# Patient Record
Sex: Female | Born: 1975 | Race: White | Hispanic: No | Marital: Married | Smoking: Never smoker
Health system: Southern US, Community
[De-identification: ages and names within clinical notes are randomized; demographics above are authoritative.]

---

## 2016-01-09 ENCOUNTER — Ambulatory Visit: Payer: Self-pay | Admitting: Physician Assistant

## 2016-01-09 ENCOUNTER — Encounter: Payer: Self-pay | Admitting: Physician Assistant

## 2016-01-09 VITALS — BP 129/77 | HR 81 | Temp 98.7°F

## 2016-01-09 DIAGNOSIS — J0100 Acute maxillary sinusitis, unspecified: Secondary | ICD-10-CM

## 2016-01-09 MED ORDER — AZITHROMYCIN 250 MG PO TABS: ORAL_TABLET | ORAL | 0 refills | Status: DC

## 2016-01-09 MED ORDER — FLUTICASONE PROPIONATE 50 MCG/ACT NA SUSP: 2.0000 | Freq: Every day | NASAL | 6 refills | Status: DC

## 2016-01-09 NOTE — Progress Notes (Signed)
S: C/o runny nose and congestion for 5 days, no fever, chills, cp/sob, v/d; mucus is green and thick, cough is sporadic, c/o of facial and dental pain.   Using otc meds: dayquil and nyquil  O: PE: vitals wnl, nad, perrl eomi, normocephalic, tms dull, nasal mucosa red and swollen, throat injected, neck supple no lymph, lungs c t a, cv rrr, neuro intact  A:  Acute sinusitis   P: drink fluids, continue regular meds , use otc meds of choice, return if not improving in 5 days, return earlier if worsening , zpack flonase

## 2016-06-18 ENCOUNTER — Encounter: Payer: Self-pay | Admitting: Obstetrics and Gynecology

## 2016-06-19 ENCOUNTER — Ambulatory Visit (INDEPENDENT_AMBULATORY_CARE_PROVIDER_SITE_OTHER): Payer: Managed Care, Other (non HMO) | Admitting: Certified Nurse Midwife

## 2016-06-19 ENCOUNTER — Encounter: Payer: Self-pay | Admitting: Certified Nurse Midwife

## 2016-06-19 VITALS — BP 124/77 | HR 82 | Ht 63.0 in | Wt 196.0 lb

## 2016-06-19 DIAGNOSIS — Z1231 Encounter for screening mammogram for malignant neoplasm of breast: Secondary | ICD-10-CM

## 2016-06-19 DIAGNOSIS — Z124 Encounter for screening for malignant neoplasm of cervix: Secondary | ICD-10-CM | POA: Diagnosis not present

## 2016-06-19 DIAGNOSIS — Z01419 Encounter for gynecological examination (general) (routine) without abnormal findings: Secondary | ICD-10-CM

## 2016-06-19 NOTE — Patient Instructions (Signed)

## 2016-06-19 NOTE — Progress Notes (Signed)
GYNECOLOGY ANNUAL PREVENTATIVE CARE ENCOUNTER NOTE  Subjective:   Patricia Leon is a 41 y.o. female here for a routine annual gynecologic exam.  Current complaints: None.   Denies abnormal vaginal bleeding, discharge, pelvic pain, problems with intercourse or other gynecologic concerns.    Gynecologic History Patient's last menstrual period was 05/31/2016. Contraception: condoms Last Pap: never had one.  Last mammogram: never had one  Obstetric History OB History  Gravida Para Term Preterm AB Living  0 0 0 0 0 0  SAB TAB Ectopic Multiple Live Births  0 0 0 0 0        History reviewed. No pertinent past medical history.  History reviewed. No pertinent surgical history.  Current Outpatient Prescriptions on File Prior to Visit  Medication Sig Dispense Refill  . etodolac (LODINE) 300 MG capsule Take 300 mg by mouth as needed.     No current facility-administered medications on file prior to visit.     Allergies  Allergen Reactions  . Sulfa Antibiotics     Social History   Social History  . Marital status: Married    Spouse name: N/A  . Number of children: N/A  . Years of education: N/A   Occupational History  . Not on file.   Social History Main Topics  . Smoking status: Never Smoker  . Smokeless tobacco: Never Used  . Alcohol use Yes     Comment: Socially   . Drug use: No  . Sexual activity: Yes    Birth control/ protection: Condom   Other Topics Concern  . Not on file   Social History Narrative  . No narrative on file    Family History  Problem Relation Age of Onset  . Varicose Veins Maternal Grandmother   . Heart disease Maternal Grandfather     The following portions of the patient's history were reviewed and updated as appropriate: allergies, current medications, past family history, past medical history, past social history, past surgical history and problem list.  Review of Systems Constitutional: negative Eyes: negative Ears, nose, mouth,  throat, and face: negative Respiratory: negative Cardiovascular: negative Gastrointestinal: negative Genitourinary:negative Integument/breast: negative Hematologic/lymphatic: negative Musculoskeletal:negative Neurological: negative Behavioral/Psych: negative Endocrine: negative   Objective:  BP 124/77 (BP Location: Left Arm, Patient Position: Sitting, Cuff Size: Large)   Pulse 82   Ht  (1.6 m)   Wt 196 lb (88.9 kg)   LMP 05/31/2016   BMI 34.72 kg/m  CONSTITUTIONAL: Well-developed, well-nourished female in no acute distress.  HENT:  Normocephalic, atraumatic, External right and left ear normal. Oropharynx is clear and moist EYES: Conjunctivae and EOM are normal. NECK: Normal range of motion, supple, no masses.  Normal thyroid.  SKIN: Skin is warm and dry. No rash noted. Not diaphoretic. No erythema. No pallor. NEUROLOGIC: Alert and oriented to person, place, and time. Normal reflexes, muscle tone coordination. No cranial nerve deficit noted. PSYCHIATRIC: Normal mood and affect. Normal behavior. Normal judgment and thought content. CARDIOVASCULAR: Normal heart rate noted, regular rhythm RESPIRATORY: Clear to auscultation bilaterally. Effort and breath sounds normal, no problems with respiration noted. BREASTS: Symmetric in size. No masses, skin changes, nipple drainage, or lymphadenopathy. ABDOMEN: Soft, normal bowel sounds, no distention noted.  No tenderness, rebound or guarding.  PELVIC: Normal appearing external genitalia; normal appearing vaginal mucosa and cervix. white discharge noted.  Pap smear obtained.  Normal uterine size, no other palpable masses, no uterine or adnexal tenderness. MUSCULOSKELETAL: Normal range of motion. No tenderness.  No cyanosis, clubbing,  or edema.  2+ distal pulses.   Assessment:  Annual gynecologic examination with pap smear Mammogram scheduled Labs: CBC, TSH, Lipid panel, fasting glucose. She will return when fasting to have labs drawn    Plan:  Will follow up results of pap smear and manage accordingly. Mammogram scheduled Routine preventative health maintenance measures emphasized. Please refer to After Visit Summary for other counseling recommendations.    Doreene Burke, CNM [facility]

## 2016-06-23 ENCOUNTER — Other Ambulatory Visit: Payer: Managed Care, Other (non HMO)

## 2016-06-23 ENCOUNTER — Other Ambulatory Visit: Payer: Self-pay | Admitting: Certified Nurse Midwife

## 2016-06-24 ENCOUNTER — Telehealth: Payer: Self-pay | Admitting: Certified Nurse Midwife

## 2016-06-24 LAB — TSH: TSH: 2.25 u[IU]/mL (ref 0.450–4.500)

## 2016-06-24 LAB — PAP IG AND HPV HIGH-RISK
HPV, HIGH-RISK: NEGATIVE
PAP SMEAR COMMENT: 0

## 2016-06-24 LAB — CBC WITH DIFFERENTIAL/PLATELET
BASOS: 0 %
Basophils Absolute: 0 10*3/uL (ref 0.0–0.2)
EOS (ABSOLUTE): 0.1 10*3/uL (ref 0.0–0.4)
EOS: 2 %
HEMATOCRIT: 42.4 % (ref 34.0–46.6)
Hemoglobin: 13.9 g/dL (ref 11.1–15.9)
IMMATURE GRANULOCYTES: 1 %
Immature Grans (Abs): 0 10*3/uL (ref 0.0–0.1)
Lymphocytes Absolute: 1.8 10*3/uL (ref 0.7–3.1)
Lymphs: 28 %
MCH: 30 pg (ref 26.6–33.0)
MCHC: 32.8 g/dL (ref 31.5–35.7)
MCV: 91 fL (ref 79–97)
MONOS ABS: 0.8 10*3/uL (ref 0.1–0.9)
Monocytes: 12 %
NEUTROS PCT: 57 %
Neutrophils Absolute: 3.7 10*3/uL (ref 1.4–7.0)
PLATELETS: 225 10*3/uL (ref 150–379)
RBC: 4.64 x10E6/uL (ref 3.77–5.28)
RDW: 12.8 % (ref 12.3–15.4)
WBC: 6.4 10*3/uL (ref 3.4–10.8)

## 2016-06-24 LAB — BASIC METABOLIC PANEL
BUN / CREAT RATIO: 16 (ref 9–23)
BUN: 13 mg/dL (ref 6–24)
CALCIUM: 9.9 mg/dL (ref 8.7–10.2)
CO2: 20 mmol/L (ref 18–29)
Chloride: 105 mmol/L (ref 96–106)
Creatinine, Ser: 0.81 mg/dL (ref 0.57–1.00)
GFR, EST AFRICAN AMERICAN: 104 mL/min/{1.73_m2} (ref >59)
GFR, EST NON AFRICAN AMERICAN: 90 mL/min/{1.73_m2} (ref >59)
Glucose: 94 mg/dL (ref 65–99)
POTASSIUM: 4.7 mmol/L (ref 3.5–5.2)
Sodium: 147 mmol/L — ABNORMAL HIGH (ref 134–144)

## 2016-06-24 LAB — LIPID PANEL
Chol/HDL Ratio: 4.9 ratio — ABNORMAL HIGH (ref 0.0–4.4)
Cholesterol, Total: 161 mg/dL (ref 100–199)
HDL: 33 mg/dL — AB (ref >39)
LDL Calculated: 97 mg/dL (ref 0–99)
Triglycerides: 153 mg/dL — ABNORMAL HIGH (ref 0–149)
VLDL CHOLESTEROL CAL: 31 mg/dL (ref 5–40)

## 2016-06-24 LAB — VITAMIN D 25 HYDROXY (VIT D DEFICIENCY, FRACTURES): Vit D, 25-Hydroxy: 37.7 ng/mL (ref 30.0–100.0)

## 2016-06-24 LAB — HEMOGLOBIN A1C
ESTIMATED AVERAGE GLUCOSE: 105 mg/dL
HEMOGLOBIN A1C: 5.3 % (ref 4.8–5.6)

## 2016-06-24 NOTE — Telephone Encounter (Signed)
Message left for Nalia to call back for pap smear results.   Doreene Burke, CNM

## 2016-06-25 ENCOUNTER — Telehealth: Payer: Self-pay | Admitting: Certified Nurse Midwife

## 2016-06-25 NOTE — Telephone Encounter (Signed)
error 

## 2016-06-25 NOTE — Telephone Encounter (Signed)
Patient returned Annie's call for results  Please call

## 2016-07-22 ENCOUNTER — Ambulatory Visit
Admission: RE | Admit: 2016-07-22 | Discharge: 2016-07-22 | Disposition: A | Discharge status: Home / Self Care | Payer: Managed Care, Other (non HMO) | Source: Ambulatory Visit | Attending: Certified Nurse Midwife | Admitting: Certified Nurse Midwife

## 2016-07-22 DIAGNOSIS — Z1231 Encounter for screening mammogram for malignant neoplasm of breast: Secondary | ICD-10-CM | POA: Diagnosis not present

## 2016-07-22 DIAGNOSIS — Z01419 Encounter for gynecological examination (general) (routine) without abnormal findings: Secondary | ICD-10-CM

## 2017-06-23 ENCOUNTER — Encounter: Payer: Managed Care, Other (non HMO) | Admitting: Certified Nurse Midwife

## 2017-06-28 ENCOUNTER — Encounter: Payer: Self-pay | Admitting: Certified Nurse Midwife

## 2017-06-28 ENCOUNTER — Ambulatory Visit (INDEPENDENT_AMBULATORY_CARE_PROVIDER_SITE_OTHER): Payer: Managed Care, Other (non HMO) | Admitting: Certified Nurse Midwife

## 2017-06-28 VITALS — BP 110/68 | HR 65 | Ht 63.0 in | Wt 203.0 lb

## 2017-06-28 DIAGNOSIS — Z1239 Encounter for other screening for malignant neoplasm of breast: Secondary | ICD-10-CM

## 2017-06-28 DIAGNOSIS — Z0000 Encounter for general adult medical examination without abnormal findings: Secondary | ICD-10-CM

## 2017-06-28 DIAGNOSIS — Z1231 Encounter for screening mammogram for malignant neoplasm of breast: Secondary | ICD-10-CM

## 2017-06-28 NOTE — Patient Instructions (Signed)
Preventive Care 18-39 Years, Female Preventive care refers to lifestyle choices and visits with your health care provider that can promote health and wellness. What does preventive care include?  A yearly physical exam. This is also called an annual well check.  Dental exams once or twice a year.  Routine eye exams. Ask your health care provider how often you should have your eyes checked.  Personal lifestyle choices, including: ? Daily care of your teeth and gums. ? Regular physical activity. ? Eating a healthy diet. ? Avoiding tobacco and drug use. ? Limiting alcohol use. ? Practicing safe sex. ? Taking vitamin and mineral supplements as recommended by your health care provider. What happens during an annual well check? The services and screenings done by your health care provider during your annual well check will depend on your age, overall health, lifestyle risk factors, and family history of disease. Counseling Your health care provider may ask you questions about your:  Alcohol use.  Tobacco use.  Drug use.  Emotional well-being.  Home and relationship well-being.  Sexual activity.  Eating habits.  Work and work Statistician.  Method of birth control.  Menstrual cycle.  Pregnancy history.  Screening You may have the following tests or measurements:  Height, weight, and BMI.  Diabetes screening. This is done by checking your blood sugar (glucose) after you have not eaten for a while (fasting).  Blood pressure.  Lipid and cholesterol levels. These may be checked every 5 years starting at age 66.  Skin check.  Hepatitis C blood test.  Hepatitis B blood test.  Sexually transmitted disease (STD) testing.  BRCA-related cancer screening. This may be done if you have a family history of breast, ovarian, tubal, or peritoneal cancers.  Pelvic exam and Pap test. This may be done every 3 years starting at age 40. Starting at age 59, this may be done every 5  years if you have a Pap test in combination with an HPV test.  Discuss your test results, treatment options, and if necessary, the need for more tests with your health care provider. Vaccines Your health care provider may recommend certain vaccines, such as:  Influenza vaccine. This is recommended every year.  Tetanus, diphtheria, and acellular pertussis (Tdap, Td) vaccine. You may need a Td booster every 10 years.  Varicella vaccine. You may need this if you have not been vaccinated.  HPV vaccine. If you are 69 or younger, you may need three doses over 6 months.  Measles, mumps, and rubella (MMR) vaccine. You may need at least one dose of MMR. You may also need a second dose.  Pneumococcal 13-valent conjugate (PCV13) vaccine. You may need this if you have certain conditions and were not previously vaccinated.  Pneumococcal polysaccharide (PPSV23) vaccine. You may need one or two doses if you smoke cigarettes or if you have certain conditions.  Meningococcal vaccine. One dose is recommended if you are age 27-21 years and a first-year college student living in a residence hall, or if you have one of several medical conditions. You may also need additional booster doses.  Hepatitis A vaccine. You may need this if you have certain conditions or if you travel or work in places where you may be exposed to hepatitis A.  Hepatitis B vaccine. You may need this if you have certain conditions or if you travel or work in places where you may be exposed to hepatitis B.  Haemophilus influenzae type b (Hib) vaccine. You may need this if  you have certain risk factors.  Talk to your health care provider about which screenings and vaccines you need and how often you need them. This information is not intended to replace advice given to you by your health care provider. Make sure you discuss any questions you have with your health care provider. Document Released: 04/21/2001 Document Revised: 11/13/2015  Document Reviewed: 12/25/2014 Elsevier Interactive Patient Education  [employer].

## 2017-06-28 NOTE — Progress Notes (Addendum)
GYNECOLOGY ANNUAL PREVENTATIVE CARE ENCOUNTER NOTE  Subjective:   Patricia BlamerSusan Leon is a 42 y.o. G0P0000 female here for a routine annual gynecologic exam.  Current complaints: none.   Denies abnormal vaginal bleeding, discharge, pelvic pain, problems with intercourse or other gynecologic concerns. She states that she is not using contraception because she and her partner are going to see what happens.    Gynecologic History No LMP recorded. Contraception: none Last Pap: 06/19/16. Results were: normal Last mammogram: 07/22/2016. Results were: normal  Obstetric History OB History  Gravida Para Term Preterm AB Living  0 0 0 0 0 0  SAB TAB Ectopic Multiple Live Births  0 0 0 0 0    History reviewed. No pertinent past medical history.  History reviewed. No pertinent surgical history.  Current Outpatient Medications on File Prior to Visit  Medication Sig Dispense Refill  . etodolac (LODINE) 300 MG capsule Take 300 mg by mouth as needed.     No current facility-administered medications on file prior to visit.     Allergies  Allergen Reactions  . Sulfa Antibiotics     Social History   Socioeconomic History  . Marital status: Married    Spouse name: Not on file  . Number of children: Not on file  . Years of education: Not on file  . Highest education level: Not on file  Occupational History  . Not on file  Social Needs  . Financial resource strain: Not on file  . Food insecurity:    Worry: Not on file    Inability: Not on file  . Transportation needs:    Medical: Not on file    Non-medical: Not on file  Tobacco Use  . Smoking status: Never Smoker  . Smokeless tobacco: Never Used  Substance and Sexual Activity  . Alcohol use: Yes    Comment: Socially   . Drug use: No  . Sexual activity: Yes    Birth control/protection: Condom  Lifestyle  . Physical activity:    Days per week: Not on file    Minutes per session: Not on file  . Stress: Not on file  Relationships  .  Social connections:    Talks on phone: Not on file    Gets together: Not on file    Attends religious service: Not on file    Active member of club or organization: Not on file    Attends meetings of clubs or organizations: Not on file    Relationship status: Not on file  . Intimate partner violence:    Fear of current or ex partner: Not on file    Emotionally abused: Not on file    Physically abused: Not on file    Forced sexual activity: Not on file  Other Topics Concern  . Not on file  Social History Narrative  . Not on file    Family History  Problem Relation Age of Onset  . Varicose Veins Maternal Grandmother   . Heart disease Maternal Grandfather   . Breast cancer Maternal Aunt [id number]       1/2 sister by same father    The following portions of the patient's history were reviewed and updated as appropriate: allergies, current medications, past family history, past medical history, past social history, past surgical history and problem list.  Review of Systems Pertinent items noted in HPI and remainder of comprehensive ROS otherwise negative.   Objective:  There were no vitals taken for this visit. CONSTITUTIONAL: Well-developed, well-nourished  over weight female in no acute distress.  HENT:  Normocephalic, atraumatic, External right and left ear normal. Oropharynx is clear and moist EYES: Conjunctivae and EOM are normal. Pupils are equal, round, and reactive to light. No scleral icterus.  NECK: Normal range of motion, supple, no masses.  Normal thyroid.  SKIN: Skin is warm and dry. No rash noted. Not diaphoretic. No erythema. No pallor. NEUROLOGIC: Alert and oriented to person, place, and time. Normal reflexes, muscle tone coordination. No cranial nerve deficit noted. PSYCHIATRIC: Normal mood and affect. Normal behavior. Normal judgment and thought content. CARDIOVASCULAR: Normal heart rate noted, regular rhythm RESPIRATORY: Clear to auscultation bilaterally. Effort and  breath sounds normal, no problems with respiration noted. BREASTS: Symmetric in size. No masses, skin changes, nipple drainage, or lymphadenopathy. ABDOMEN: Soft, normal bowel sounds, no distention noted.  No tenderness, rebound or guarding.  PELVIC: Normal appearing external genitalia; normal appearing vaginal mucosa and cervix.  No abnormal discharge noted.  Pap smear obtained.  Normal uterine size, no other palpable masses, no uterine or adnexal tenderness. MUSCULOSKELETAL: Normal range of motion. No tenderness.  No cyanosis, clubbing, or edema.  2+ distal pulses.   Assessment and Plan:  Well women exam. She declines any fertility testing at this time stating that she will try for a few more months without intervention. Pap smear not indicated Mammogram scheduled Routine preventative health maintenance measures emphasized. Please refer to After Visit Summary for other counseling recommendations.    Doreene Burke, CNM

## 2017-08-13 ENCOUNTER — Encounter (INDEPENDENT_AMBULATORY_CARE_PROVIDER_SITE_OTHER): Payer: Self-pay

## 2017-08-13 ENCOUNTER — Ambulatory Visit
Admission: RE | Admit: 2017-08-13 | Discharge: 2017-08-13 | Disposition: A | Discharge status: Home / Self Care | Payer: Managed Care, Other (non HMO) | Source: Ambulatory Visit | Attending: Certified Nurse Midwife | Admitting: Certified Nurse Midwife

## 2017-08-13 DIAGNOSIS — Z1231 Encounter for screening mammogram for malignant neoplasm of breast: Secondary | ICD-10-CM | POA: Diagnosis present

## 2017-08-13 DIAGNOSIS — Z1239 Encounter for other screening for malignant neoplasm of breast: Secondary | ICD-10-CM

## 2017-08-16 IMAGING — MG 2D DIGITAL SCREENING BILATERAL MAMMOGRAM WITH CAD AND ADJUNCT TO
8 of 13 series · 8 of 29 positions shown · non-contrast
Comparison: None.

CLINICAL DATA: Screening.

EXAM:
2D DIGITAL SCREENING BILATERAL MAMMOGRAM WITH CAD AND ADJUNCT TOMO

[L XCCL]
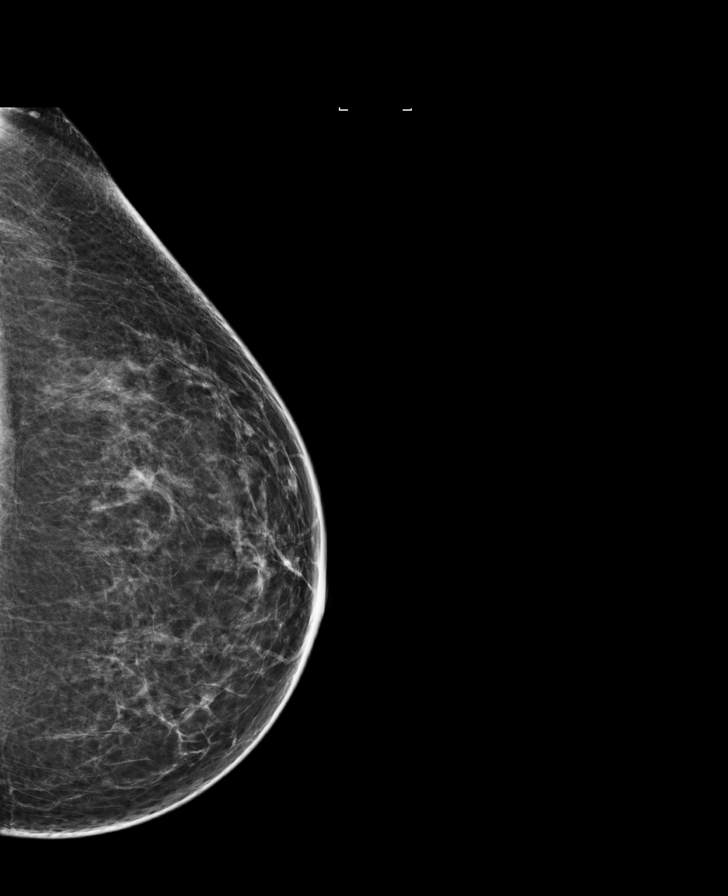

[R MLO synth-2D]
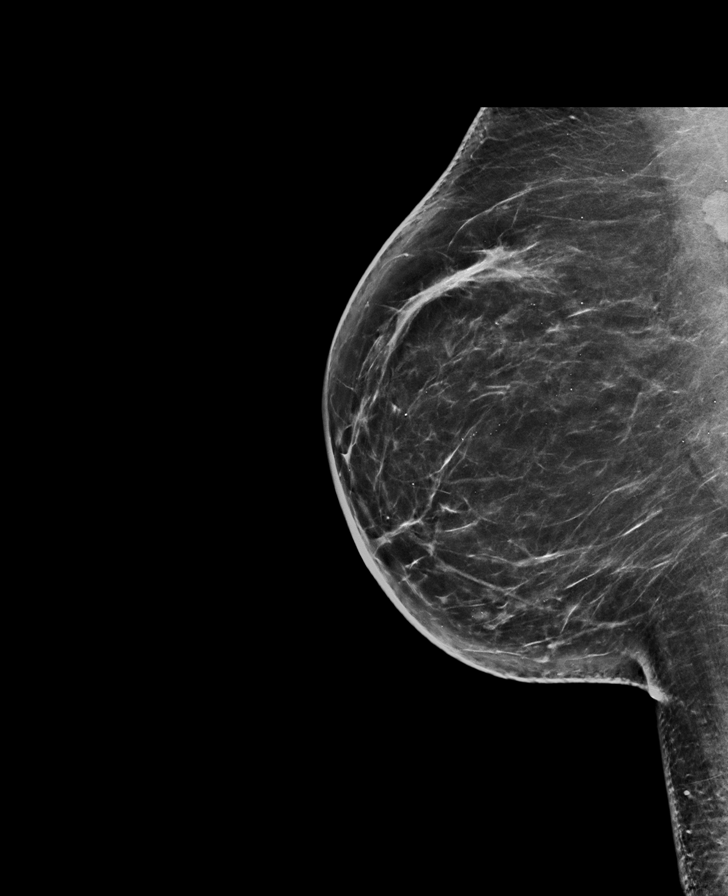

[L MLO]
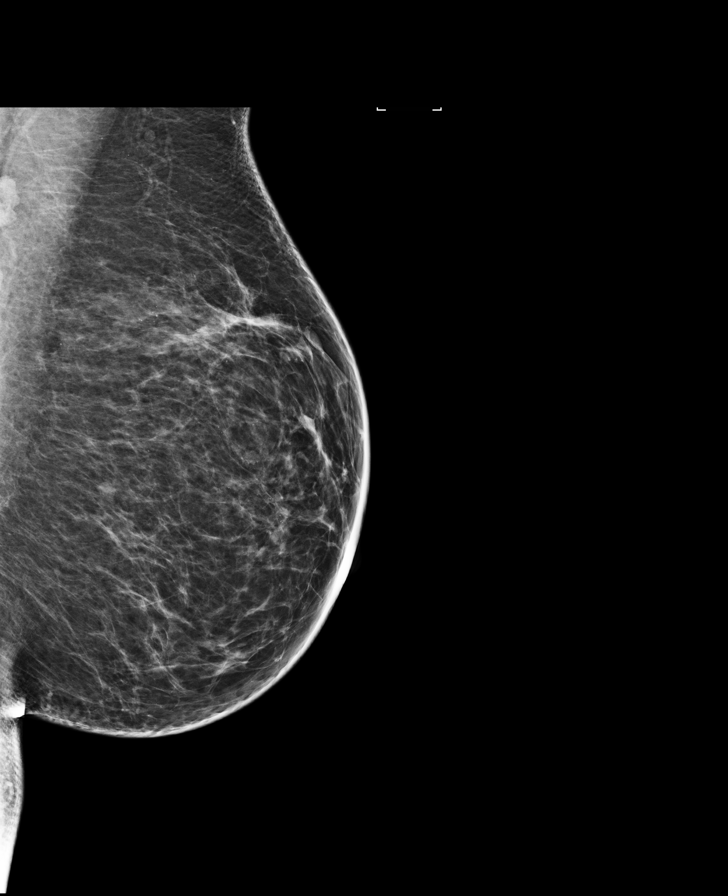

[R CC]
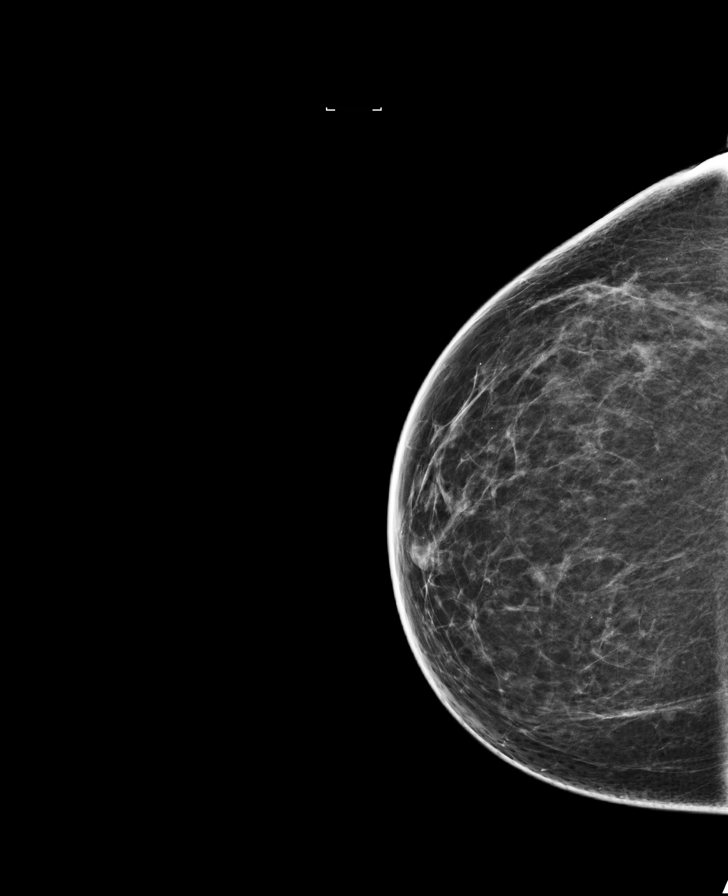

[R MLO]
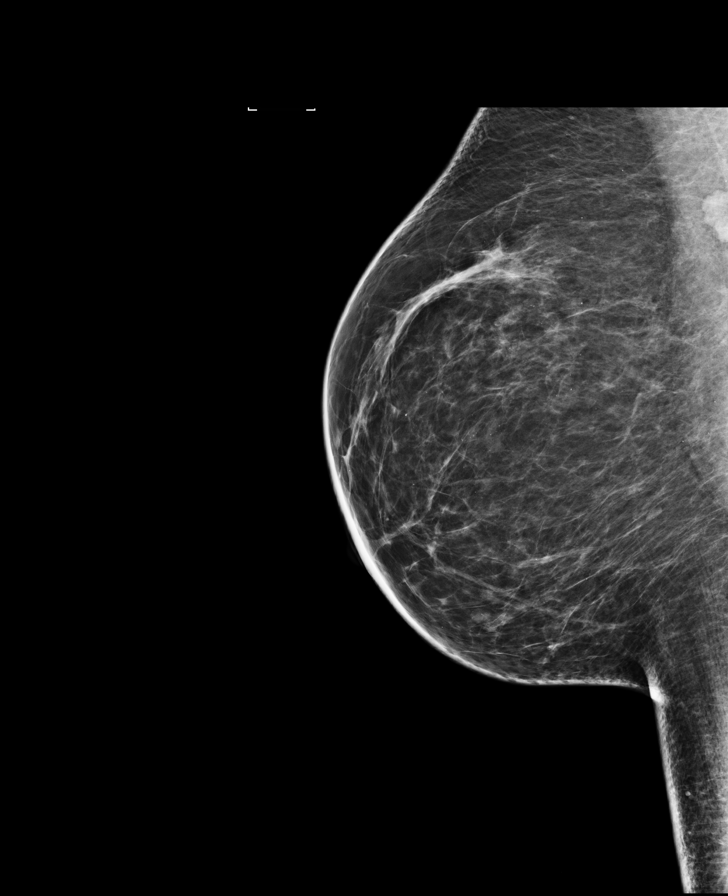

[R CC synth-2D]
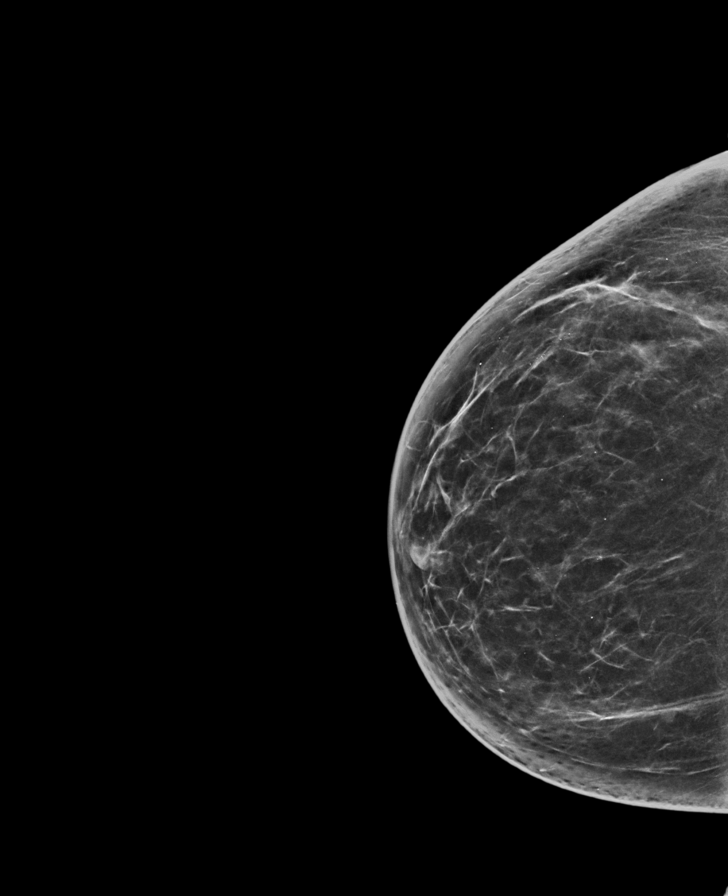

[L CC synth-2D]
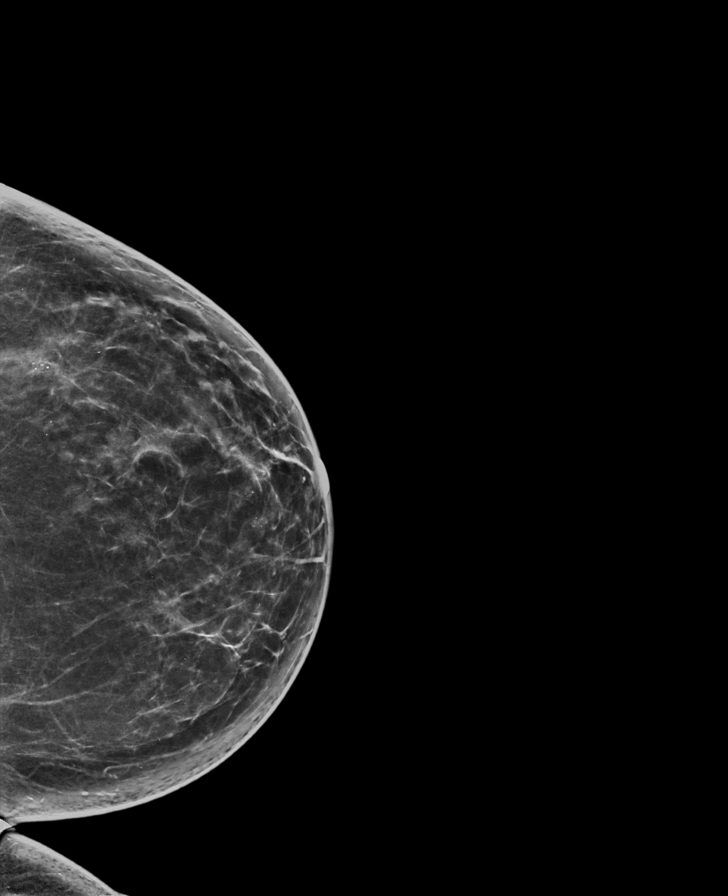

[L MLO synth-2D]
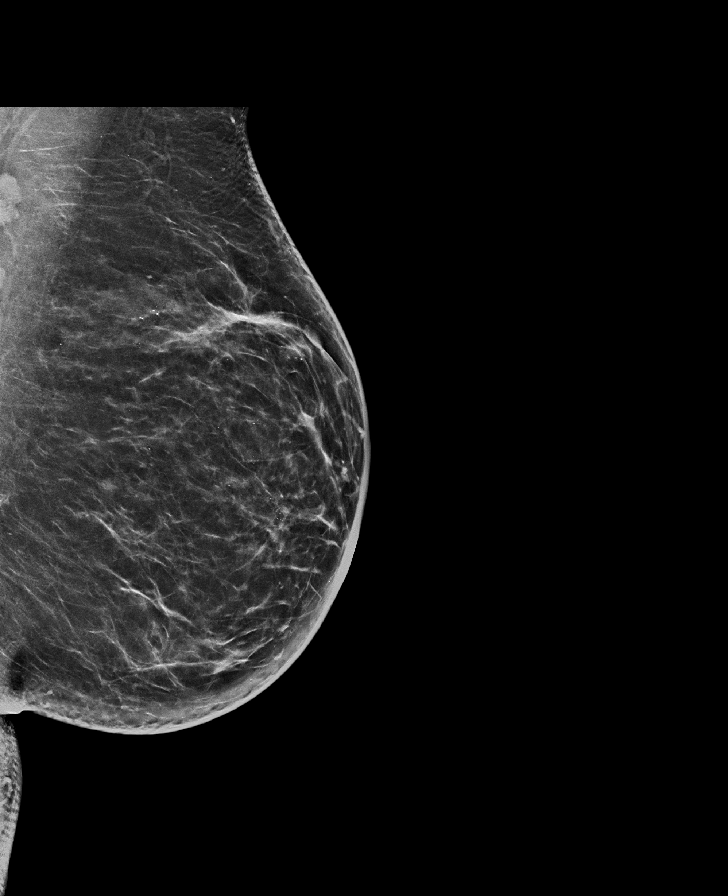

[8 of 29 positions shown; findings below may reference images not displayed]

ACR Breast Density Category b: There are scattered areas of
fibroglandular density.
FINDINGS: There are no findings suspicious for malignancy. Images were
processed with CAD.
IMPRESSION: No mammographic evidence of malignancy. A result letter of this
screening mammogram will be mailed directly to the patient.

RECOMMENDATION:
Screening mammogram in one year. (Code:EE-M-3AZ)

BI-RADS CATEGORY  1: Negative.

## 2017-11-23 ENCOUNTER — Ambulatory Visit: Payer: Self-pay | Admitting: Family Medicine

## 2017-11-23 VITALS — BP 147/65 | HR 97 | Temp 99.0°F | Resp 18

## 2017-11-23 DIAGNOSIS — R05 Cough: Secondary | ICD-10-CM

## 2017-11-23 DIAGNOSIS — R059 Cough, unspecified: Secondary | ICD-10-CM

## 2017-11-23 DIAGNOSIS — J0100 Acute maxillary sinusitis, unspecified: Secondary | ICD-10-CM

## 2017-11-23 MED ORDER — BENZONATATE 200 MG PO CAPS: 200.0000 mg | ORAL_CAPSULE | Freq: Every evening | ORAL | 0 refills | Status: DC | PRN

## 2017-11-23 MED ORDER — AMOXICILLIN-POT CLAVULANATE 875-125 MG PO TABS: 1.0000 | ORAL_TABLET | Freq: Two times a day (BID) | ORAL | 0 refills | Status: AC

## 2017-11-23 NOTE — Progress Notes (Signed)
Subjective: congestion     Patricia Leon is a 42 y.o. female who presents for evaluation of nasal congestion, facial pressure, fatigue, and nonproductive/productive cough with a small amount of green sputum for 5 days.  Patient reports that yesterday she developed a low-grade fever (T-max 100.4), which has continued today as well.  Reports the facial pressure began yesterday. Treatment to date: DayQuil and NyQuil.  Denies rash, nausea, vomiting, diarrhea, SOB, wheezing, chest or back pain, ear pain, sore throat, difficulty swallowing, confusion, anosmia/hyposmia, headache, body aches, ocular pruritis/discharge, or severe symptoms. History of smoking, asthma, COPD: Negative. History of recurrent sinus and/or lung infections: Negative. Antibiotic use in the last 3 months: Negative.   Review of Systems Pertinent items noted in HPI and remainder of comprehensive ROS otherwise negative.     Objective:   Physical Exam General: Awake, alert, and oriented. No acute distress. Well developed, hydrated and nourished. Appears stated age. Nontoxic appearance.  HEENT:  PND noted.  No erythema to posterior oropharynx.  No edema or exudates of pharynx or tonsils. No erythema or bulging of TM.  Mild erythema/edema to nasal mucosa.  Bilateral maxillary sinus tenderness, left greater than right.  Remainder of sinuses nontender. Supple neck without adenopathy. Cardiac: Heart rate and rhythm are normal. No murmurs, gallops, or rubs are auscultated. S1 and S2 are heard and are of normal intensity.  Respiratory: No signs of respiratory distress. Lungs clear. No tachypnea. Able to speak in full sentences without dyspnea. Nonlabored respirations.  Skin: Skin is warm, dry and intact. Appropriate color for ethnicity. No cyanosis noted.   Assessment:    sinusitis and viral upper respiratory illness   Plan:    Discussed diagnosis and treatment of URI. Discussed the diagnosis and treatment of sinusitis. Suggested  symptomatic OTC remedies. Nasal saline spray for congestion.   Prescribed Augmentin.  Prescribed Tessalon Perles to use as needed at night for cough.  Patient has taken these medications in the past and tolerated them well. Discussed side/adverse effects of all medications. Patient's blood pressure is 147/65 today.  Discussed normal values.  Advised patient to monitor this daily and report abnormal values to her primary care provider. Discussed red flag symptoms and circumstances with which to seek medical care.   New Prescriptions   AMOXICILLIN-CLAVULANATE (AUGMENTIN) 875-125 MG TABLET    Take 1 tablet by mouth 2 (two) times daily for 10 days.   BENZONATATE (TESSALON) 200 MG CAPSULE    Take 1 capsule (200 mg total) by mouth at bedtime as needed for cough.

## 2018-06-23 ENCOUNTER — Other Ambulatory Visit: Payer: Self-pay

## 2018-06-23 ENCOUNTER — Ambulatory Visit: Payer: Managed Care, Other (non HMO) | Admitting: Adult Health

## 2018-06-23 ENCOUNTER — Encounter: Payer: Self-pay | Admitting: Adult Health

## 2018-06-23 VITALS — BP 122/68 | HR 77 | Temp 98.4°F | Resp 14

## 2018-06-23 DIAGNOSIS — S46911A Strain of unspecified muscle, fascia and tendon at shoulder and upper arm level, right arm, initial encounter: Secondary | ICD-10-CM

## 2018-06-23 DIAGNOSIS — Z3202 Encounter for pregnancy test, result negative: Secondary | ICD-10-CM

## 2018-06-23 LAB — POCT URINALYSIS DIPSTICK
Blood, UA: NEGATIVE
Glucose, UA: NEGATIVE
Ketones, UA: NEGATIVE
Leukocytes, UA: NEGATIVE
Nitrite, UA: NEGATIVE
Protein, UA: NEGATIVE
Spec Grav, UA: 1.025 (ref 1.010–1.025)
Urobilinogen, UA: 0.2 E.U./dL
pH, UA: 5.5 (ref 5.0–8.0)

## 2018-06-23 LAB — POCT URINE PREGNANCY: Preg Test, Ur: NEGATIVE

## 2018-06-23 MED ORDER — IBUPROFEN 800 MG PO TABS: 800.0000 mg | ORAL_TABLET | Freq: Three times a day (TID) | ORAL | 0 refills | Status: DC | PRN

## 2018-06-23 MED ORDER — CYCLOBENZAPRINE HCL 10 MG PO TABS
10.0000 mg | ORAL_TABLET | Freq: Three times a day (TID) | ORAL | 0 refills | Status: DC | PRN
Start: 2018-06-23 — End: 2019-10-06

## 2018-06-23 NOTE — Patient Instructions (Addendum)
Muscle Strain A muscle strain is an injury that happens when a muscle is stretched longer than normal. This can happen during a fall, sports, or lifting. This can tear some muscle fibers. Usually, recovery from muscle strain takes 1-2 weeks. Complete healing normally takes 5-6 weeks. This condition is first treated with PRICE therapy. This involves:  Protecting your muscle from being injured again.  Resting your injured muscle.  Icing your injured muscle.  Applying pressure (compression) to your injured muscle. This may be done with a splint or elastic bandage.  Raising (elevating) your injured muscle. Your doctor may also recommend medicine for pain. Follow these instructions at home: If you have a splint:  Wear the splint as told by your doctor. Take it off only as told by your doctor.  Loosen the splint if your fingers or toes tingle, get numb, or turn cold and blue.  Keep the splint clean.  If the splint is not waterproof: ? Do not let it get wet. ? Cover it with a watertight covering when you take a bath or a shower. Managing pain, stiffness, and swelling   If directed, put ice on your injured area. ? If you have a removable splint, take it off as told by your doctor. ? Put ice in a plastic bag. ? Place a towel between your skin and the bag. ? Leave the ice on for 20 minutes, 2-3 times a day.  Move your fingers or toes often. This helps to avoid stiffness and lessen swelling.  Raise your injured area above the level of your heart while you are sitting or lying down.  Wear an elastic bandage as told by your doctor. Make sure it is not too tight. General instructions  Take over-the-counter and prescription medicines only as told by your doctor.  Limit your activity. Rest your injured muscle as told by your doctor. Your doctor may say that gentle movements are okay.  If physical therapy was prescribed, do exercises as told by your doctor.  Do not put pressure on any  part of the splint until it is fully hardened. This may take many hours.  Do not use any products that contain nicotine or tobacco, such as cigarettes and e-cigarettes. These can delay bone healing. If you need help quitting, ask your doctor.  Warm up before you exercise. This helps to prevent more muscle strains.  Ask your doctor when it is safe to drive if you have a splint.  Keep all follow-up visits as told by your doctor. This is important. Contact a doctor if:  You have more pain or swelling in your injured area. Get help right away if:  You have any of these problems in your injured area: ? You have numbness. ? You have tingling. ? You lose a lot of strength. Summary  A muscle strain is an injury that happens when a muscle is stretched longer than normal.  This condition is first treated with PRICE therapy. This includes protecting, resting, icing, adding pressure, and raising your injury.  Limit your activity. Rest your injured muscle as told by your doctor. Your doctor may say that gentle movements are okay.  Warm up before you exercise. This helps to prevent more muscle strains. This information is not intended to replace advice given to you by your health care provider. Make sure you discuss any questions you have with your health care provider. Document Released: 12/03/2007 Document Revised: 04/01/2016 Document Reviewed: 04/01/2016 [employer] Interactive Patient Education  2019 [employer] Cervical Sprain  A cervical sprain is a stretch or tear in one or more of the tough, cord-like tissues that connect bones (ligaments) in the neck. Cervical sprains can range from mild to severe. Severe cervical sprains can cause the spinal bones (vertebrae) in the neck to be unstable. This can lead to spinal cord damage and can result in serious nervous system problems. The amount of time that it takes for a cervical sprain to get better depends on the cause and extent of the injury.  Most cervical sprains heal in 4-6 weeks. What are the causes? Cervical sprains may be caused by an injury (trauma), such as from a motor vehicle accident, a fall, or sudden forward and backward whipping movement of the head and neck (whiplash injury). Mild cervical sprains may be caused by wear and tear over time, such as from poor posture, sitting in a chair that does not provide support, or looking up or down for long periods of time. What increases the risk? The following factors may make you more likely to develop this condition:  Participating in activities that have a high risk of trauma to the neck. These include contact sports, auto racing, gymnastics, and diving.  Taking risks when driving or riding in a motor vehicle, such as speeding.  Having osteoarthritis of the spine.  Having poor strength and flexibility of the neck.  A previous neck injury.  Having poor posture.  Spending a lot of time in certain positions that put stress on the neck, such as sitting at a computer for long periods of time. What are the signs or symptoms? Symptoms of this condition include:  Pain, soreness, stiffness, tenderness, swelling, or a burning sensation in the front, back, or sides of the neck.  Sudden tightening of neck muscles that you cannot control (muscle spasms).  Pain in the shoulders or upper back.  Limited ability to move the neck.  Headache.  Dizziness.  Nausea.  Vomiting.  Weakness, numbness, or tingling in a hand or an arm. Symptoms may develop right away after injury, or they may develop over a few days. In some cases, symptoms may go away with treatment and return (recur) over time. How is this diagnosed? This condition may be diagnosed based on:  Your medical history.  Your symptoms.  Any recent injuries or known neck problems that you have, such as arthritis in the neck.  A physical exam.  Imaging tests, such as: ? X-rays. ? MRI. ? CT scan. How is this  treated? This condition is treated by resting and icing the injured area and doing physical therapy exercises. Depending on the severity of your condition, treatment may also include:  Keeping your neck in place (immobilized) for periods of time. This may be done using: ? A cervical collar. This supports your chin and the back of your head. ? A cervical traction device. This is a sling that holds up your head. This removes weight and pressure from your neck, and it may help to relieve pain.  Medicines that help to relieve pain and inflammation.  Medicines that help to relax your muscles (muscle relaxants).  Surgery. This is rare. Follow these instructions at home: If you have a cervical collar:   Wear it as told by your health care provider. Do not remove the collar unless instructed by your health care provider.  Ask your health care provider before you make any adjustments to your collar.  If you have long hair, keep it outside  of the collar.  Ask your health care provider if you can remove the collar for cleaning and bathing. If you are allowed to remove the collar for cleaning or bathing: ? Follow instructions from your health care provider about how to remove the collar safely. ? Clean the collar by wiping it with mild soap and water and drying it completely. ? If your collar has removable pads, remove them every 1-2 days and wash them by hand with soap and water. Let them air-dry completely before you put them back in the collar. ? Check your skin under the collar for irritation or sores. If you see any, tell your health care provider. Managing pain, stiffness, and swelling   If directed, use a cervical traction device as told by your health care provider.  If directed, apply heat to the affected area before you do your physical therapy or as often as told by your health care provider. Use the heat source that your health care provider recommends, such as a moist heat pack or a  heating pad. ? Place a towel between your skin and the heat source. ? Leave the heat on for 20-30 minutes. ? Remove the heat if your skin turns bright red. This is especially important if you are unable to feel pain, heat, or cold. You may have a greater risk of getting burned.  If directed, put ice on the affected area: ? Put ice in a plastic bag. ? Place a towel between your skin and the bag. ? Leave the ice on for 20 minutes, 2-3 times a day. Activity  Do not drive while wearing a cervical collar. If you do not have a cervical collar, ask your health care provider if it is safe to drive while your neck heals.  Do not drive or use heavy machinery while taking prescription pain medicine or muscle relaxants, unless your health care provider approves.  Do not lift anything that is heavier than 10 lb (4.5 kg) until your health care provider tells you that it is safe.  Rest as directed by your health care provider. Avoid positions and activities that make your symptoms worse. Ask your health care provider what activities are safe for you.  If physical therapy was prescribed, do exercises as told by your health care provider or physical therapist. General instructions  Take over-the-counter and prescription medicines only as told by your health care provider.  Do not use any products that contain nicotine or tobacco, such as cigarettes and e-cigarettes. These can delay healing. If you need help quitting, ask your health care provider.  Keep all follow-up visits as told by your health care provider or physical therapist. This is important. How is this prevented? To prevent a cervical sprain from happening again:  Use and maintain good posture. Make any needed adjustments to your workstation to help you use good posture.  Exercise regularly as directed by your health care provider or physical therapist.  Avoid risky activities that may cause a cervical sprain. Contact a health care  provider if:  You have symptoms that get worse or do not get better after 2 weeks of treatment.  You have pain that gets worse or does not get better with medicine.  You develop new, unexplained symptoms.  You have sores or irritated skin on your neck from wearing your cervical collar. Get help right away if:  You have severe pain.  You develop numbness, tingling, or weakness in any part of your body.  You  cannot move a part of your body (you have paralysis).  You have neck pain along with: ? Severe dizziness. ? Headache. Summary  A cervical sprain is a stretch or tear in one or more of the tough, cord-like tissues that connect bones (ligaments) in the neck.  Cervical sprains may be caused by an injury (trauma), such as from a motor vehicle accident, a fall, or sudden forward and backward whipping movement of the head and neck (whiplash injury).  Symptoms may develop right away after injury, or they may develop over a few days.  This condition is treated by resting and icing the injured area and doing physical therapy exercises. This information is not intended to replace advice given to you by your health care provider. Make sure you discuss any questions you have with your health care provider. Document Released: 12/21/2006 Document Revised: 10/23/2015 Document Reviewed: 10/23/2015 Elsevier Interactive Patient Education  2019 [employer] NECK TENSION: Assisted Stretch    Reach right arm around head and hold slightly above ear. Gently bring right ear toward right shoulder. Hold position for ___ breaths. Repeat with other arm. Repeat ___ times, alternating arms. Do ___ times per day.  Copyright  VHI. All rights reserved.  Cyclobenzaprine tablets What is this medicine? CYCLOBENZAPRINE (sye kloe BEN za preen) is a muscle relaxer. It is used to treat muscle pain, spasms, and stiffness. This medicine may be used for other purposes; ask your health care provider or  pharmacist if you have questions. COMMON BRAND NAME(S): Fexmid, Flexeril What should I tell my health care provider before I take this medicine? They need to know if you have any of these conditions: -heart disease, irregular heartbeat, or previous heart attack -liver disease -thyroid problem -an unusual or allergic reaction to cyclobenzaprine, tricyclic antidepressants, lactose, other medicines, foods, dyes, or preservatives -pregnant or trying to get pregnant -breast-feeding How should I use this medicine? Take this medicine by mouth with a glass of water. Follow the directions on the prescription label. If this medicine upsets your stomach, take it with food or milk. Take your medicine at regular intervals. Do not take it more often than directed. Talk to your pediatrician regarding the use of this medicine in children. Special care may be needed. Overdosage: If you think you have taken too much of this medicine contact a poison control center or emergency room at once. NOTE: This medicine is only for you. Do not share this medicine with others. What if I miss a dose? If you miss a dose, take it as soon as you can. If it is almost time for your next dose, take only that dose. Do not take double or extra doses. What may interact with this medicine? Do not take this medicine with any of the following medications: -MAOIs like Carbex, Eldepryl, Marplan, Nardil, and Parnate This medicine may also interact with the following medications: -alcohol -antihistamines for allergy, cough, and cold -certain medicines for anxiety or sleep -certain medicines for depression like amitriptyline, fluoxetine, sertraline -certain medicines for seizures like phenobarbital, primidone -contrast dyes -local anesthetics like lidocaine, pramoxine, tetracaine -medicines that relax muscles for surgery -narcotic medicines for pain -phenothiazines like chlorpromazine, mesoridazine, prochlorperazine This list may  not describe all possible interactions. Give your health care provider a list of all the medicines, herbs, non-prescription drugs, or dietary supplements you use. Also tell them if you smoke, drink alcohol, or use illegal drugs. Some items may interact with your medicine. What should I watch for while using  this medicine? Tell your doctor or health care professional if your symptoms do not start to get better or if they get worse. You may get drowsy or dizzy. Do not drive, use machinery, or do anything that needs mental alertness until you know how this medicine affects you. Do not stand or sit up quickly, especially if you are an older patient. This reduces the risk of dizzy or fainting spells. Alcohol may interfere with the effect of this medicine. Avoid alcoholic drinks. If you are taking another medicine that also causes drowsiness, you may have more side effects. Give your health care provider a list of all medicines you use. Your doctor will tell you how much medicine to take. Do not take more medicine than directed. Call emergency for help if you have problems breathing or unusual sleepiness. Your mouth may get dry. Chewing sugarless gum or sucking hard candy, and drinking plenty of water may help. Contact your doctor if the problem does not go away or is severe. What side effects may I notice from receiving this medicine? Side effects that you should report to your doctor or health care professional as soon as possible: -allergic reactions like skin rash, itching or hives, swelling of the face, lips, or tongue -breathing problems -chest pain -fast, irregular heartbeat -hallucinations -seizures -unusually weak or tired Side effects that usually do not require medical attention (report to your doctor or health care professional if they continue or are bothersome): -headache -nausea, vomiting This list may not describe all possible side effects. Call your doctor for medical advice about side  effects. You may report side effects to FDA at 1-800-FDA-1088. Where should I keep my medicine? Keep out of the reach of children. Store at room temperature between 15 and 30 degrees C (59 and 86 degrees F). Keep container tightly closed. Throw away any unused medicine after the expiration date. NOTE: This sheet is a summary. It may not cover all possible information. If you have questions about this medicine, talk to your doctor, pharmacist, or health care provider.  2019 [employer]/Gold Standard (2016-12-16 13:04:35)  Ibuprofen tablets and capsules What is this medicine? IBUPROFEN (eye BYOO proe fen) is a non-steroidal anti-inflammatory drug (NSAID). It is used for dental pain, fever, headaches or migraines, osteoarthritis, rheumatoid arthritis, or painful monthly periods. It can also relieve minor aches and pains caused by a cold, flu, or sore throat. This medicine may be used for other purposes; ask your health care provider or pharmacist if you have questions. COMMON BRAND NAME(S): Advil, Advil Junior Strength, Advil Migraine, Genpril, Ibren, IBU, Midol, Midol Cramps and Body Aches, Motrin, Motrin IB, Motrin Junior Strength, Motrin Migraine Pain, Samson-8, Toxicology Saliva Collection What should I tell my health care provider before I take this medicine? They need to know if you have any of these conditions: -cigarette smoker -coronary artery bypass graft (CABG) surgery within the past 2 weeks -drink more than 3 alcohol-containing drinks a day -heart disease -high blood pressure -history of stomach bleeding -kidney disease -liver disease -lung or breathing disease, like asthma -an unusual or allergic reaction to ibuprofen, aspirin, other NSAIDs, other medicines, foods, dyes, or preservatives -pregnant or trying to get pregnant -breast-feeding How should I use this medicine? Take this medicine by mouth with a glass of water. Follow the directions on the prescription label. Take this  medicine with food if your stomach gets upset. Try to not lie down for at least 10 minutes after you take the medicine. Take your medicine  at regular intervals. Do not take your medicine more often than directed. A special MedGuide will be given to you by the pharmacist with each prescription and refill. Be sure to read this information carefully each time. Talk to your pediatrician regarding the use of this medicine in children. Special care may be needed. Overdosage: If you think you have taken too much of this medicine contact a poison control center or emergency room at once. NOTE: This medicine is only for you. Do not share this medicine with others. What if I miss a dose? If you miss a dose, take it as soon as you can. If it is almost time for your next dose, take only that dose. Do not take double or extra doses. What may interact with this medicine? Do not take this medicine with any of the following medications: -cidofovir -ketorolac -methotrexate -pemetrexed This medicine may also interact with the following medications: -alcohol -aspirin -diuretics -lithium -other drugs for inflammation like prednisone -warfarin This list may not describe all possible interactions. Give your health care provider a list of all the medicines, herbs, non-prescription drugs, or dietary supplements you use. Also tell them if you smoke, drink alcohol, or use illegal drugs. Some items may interact with your medicine. What should I watch for while using this medicine? Tell your doctor or healthcare professional if your symptoms do not start to get better or if they get worse. This medicine does not prevent heart attack or stroke. In fact, this medicine may increase the chance of a heart attack or stroke. The chance may increase with longer use of this medicine and in people who have heart disease. If you take aspirin to prevent heart attack or stroke, talk with your doctor or health care professional. Do  not take other medicines that contain aspirin, ibuprofen, or naproxen with this medicine. Side effects such as stomach upset, nausea, or ulcers may be more likely to occur. Many medicines available without a prescription should not be taken with this medicine. This medicine can cause ulcers and bleeding in the stomach and intestines at any time during treatment. Ulcers and bleeding can happen without warning symptoms and can cause death. To reduce your risk, do not smoke cigarettes or drink alcohol while you are taking this medicine. You may get drowsy or dizzy. Do not drive, use machinery, or do anything that needs mental alertness until you know how this medicine affects you. Do not stand or sit up quickly, especially if you are an older patient. This reduces the risk of dizzy or fainting spells. This medicine can cause you to bleed more easily. Try to avoid damage to your teeth and gums when you brush or floss your teeth. This medicine may be used to treat migraines. If you take migraine medicines for 10 or more days a month, your migraines may get worse. Keep a diary of headache days and medicine use. Contact your healthcare professional if your migraine attacks occur more frequently. What side effects may I notice from receiving this medicine? Side effects that you should report to your doctor or health care professional as soon as possible: -allergic reactions like skin rash, itching or hives, swelling of the face, lips, or tongue -severe stomach pain -signs and symptoms of bleeding such as bloody or black, tarry stools; red or dark-brown urine; spitting up blood or brown material that looks like coffee grounds; red spots on the skin; unusual bruising or bleeding from the eye, gums, or nose -signs and symptoms  of a blood clot such as changes in vision; chest pain; severe, sudden headache; trouble speaking; sudden numbness or weakness of the face, arm, or leg -unexplained weight gain or swelling  -unusually weak or tired -yellowing of eyes or skin Side effects that usually do not require medical attention (report to your doctor or health care professional if they continue or are bothersome): -bruising -diarrhea -dizziness, drowsiness -headache -nausea, vomiting This list may not describe all possible side effects. Call your doctor for medical advice about side effects. You may report side effects to FDA at 1-800-FDA-1088. Where should I keep my medicine? Keep out of the reach of children. Store at room temperature between 15 and 30 degrees C (59 and 86 degrees F). Keep container tightly closed. Throw away any unused medicine after the expiration date. NOTE: This sheet is a summary. It may not cover all possible information. If you have questions about this medicine, talk to your doctor, pharmacist, or health care provider.  2019 Elsevier/Gold Standard (2016-10-28 12:43:57)

## 2018-06-23 NOTE — Progress Notes (Addendum)
[facility]  Subjective:    Patient ID: Patricia Leon, female    DOB: June 08, 1975, 43 y.o.   MRN: [id number]  Patient is a 43 year old female in no acute distress who comes to the clinic with complaints of " right upper back pain"   Back Pain  This is a new problem. The current episode started yesterday. The problem occurs intermittently (with movement ). Pain location: scapular - right shoulder. The quality of the pain is described as aching and burning (pulling ). The pain does not radiate. The pain is at a severity of 6/10. The pain is moderate. The symptoms are aggravated by bending and twisting (turning neck ). Stiffness is present: none  Pertinent negatives include no abdominal pain, bladder incontinence, bowel incontinence, chest pain, dysuria, fever, headaches, leg pain, numbness, paresis, paresthesias, pelvic pain, perianal numbness, tingling, weakness or weight loss. She has tried ice for the symptoms. The treatment provided no relief.   She denies any radiation of pain to neck or jaw. No radiculopathy or paresthesias. No loss of bowel or bladder control. No saddle paresthesias.  Patient  denies any cough, congestion, fever, body aches,chills, rash, chest pain, shortness of breath, nausea, vomiting, or diarrhea.    Denies any pain with breathing.  She reports she has been doing yard work the last week, since being home more and noticed this after the work.   She denies any known injury.  She denies and other pain.   She denies any history of this same pain in past. Denies any surgeries for spine or shoulder.   She denies any other concerns.   Allergies  Allergen Reactions  . Sulfa Antibiotics Rash    Patient's last menstrual period was 05/27/2018. She has not been trying to prevent pregnancy but does not think she is pregnant.   She took one expired etodolac she had last night they were four years old she reports and she noticed no  change.   Denies any kidney or liver disease.    Review of Systems  Constitutional: Negative for activity change, appetite change, chills, diaphoresis, fatigue, fever and weight loss.  HENT: Negative.   Respiratory: Negative.  Negative for apnea, cough, choking, chest tightness, shortness of breath, wheezing and stridor.   Cardiovascular: Negative for chest pain, palpitations and leg swelling.  Gastrointestinal: Negative.  Negative for abdominal distention, abdominal pain, anal bleeding, blood in stool, bowel incontinence, constipation, diarrhea, nausea, rectal pain and vomiting.  Genitourinary: Negative for bladder incontinence, decreased urine volume, difficulty urinating, dyspareunia, dysuria, enuresis, flank pain, frequency, genital sores, hematuria, menstrual problem, pelvic pain, urgency, vaginal bleeding, vaginal discharge and vaginal pain.  Musculoskeletal: Positive for back pain and neck stiffness. Negative for arthralgias, gait problem, joint swelling, myalgias and neck pain.  Skin: Negative.   Neurological: Negative.  Negative for tingling, weakness, numbness, headaches and paresthesias.  Hematological: Negative.   Psychiatric/Behavioral: Negative.        Objective:   Physical Exam Vitals signs reviewed.  Constitutional:      General: She is not in acute distress.    Appearance: Normal appearance. She is not ill-appearing, toxic-appearing or diaphoretic.  HENT:     Head: Normocephalic and atraumatic.     Nose: Nose normal.     Mouth/Throat:     Mouth: Mucous membranes are moist.  Eyes:     General: No scleral icterus.       Right eye: No discharge.  Left eye: No discharge.     Extraocular Movements: Extraocular movements intact.     Conjunctiva/sclera: Conjunctivae normal.  Neck:     Musculoskeletal: Normal range of motion and neck supple.  Cardiovascular:     Rate and Rhythm: Normal rate and regular rhythm.     Pulses: Normal pulses.     Heart sounds:  Normal heart sounds. No murmur. No friction rub. No gallop.   Pulmonary:     Effort: Pulmonary effort is normal. No respiratory distress.     Breath sounds: Normal breath sounds. No stridor. No wheezing, rhonchi or rales.  Chest:     Chest wall: No tenderness.  Abdominal:     Palpations: Abdomen is soft.  Musculoskeletal: Normal range of motion.        General: No swelling, deformity or signs of injury.     Cervical back: Normal.     Thoracic back: Normal.     Lumbar back: Normal.     Right upper arm: Normal.     Left upper arm: Normal.     Right forearm: Normal.     Left forearm: Normal.       Arms:     Right lower leg: No edema.     Left lower leg: No edema.     Comments: Area of discomfort marked on diagram. Denies right arm pain.  Trapezius muscle tenderness with palpation.   Skin:    General: Skin is warm and dry.     Capillary Refill: Capillary refill takes less than 2 seconds.     Nails: There is no clubbing.   Neurological:     General: No focal deficit present.     Mental Status: She is alert and oriented to person, place, and time.     GCS: GCS eye subscore is 4. GCS verbal subscore is 5. GCS motor subscore is 6.     Cranial Nerves: No cranial nerve deficit.     Sensory: No sensory deficit.     Motor: No weakness.     Coordination: Coordination is intact. Coordination normal.     Gait: Gait is intact. Gait normal.     Deep Tendon Reflexes: Reflexes normal.     Reflex Scores:      Tricep reflexes are 2+ on the right side and 2+ on the left side.      Bicep reflexes are 2+ on the right side and 2+ on the left side.      Brachioradialis reflexes are 2+ on the right side and 2+ on the left side.    Comments: 5/5 grip strength and upper and lower extremity.  Right shoulder range of motion normal.  Patient reports pulling of muscle with rotation of neck to left.  Range of motion is normal.  No crepitus.    Psychiatric:        Mood and Affect: Mood normal.         Behavior: Behavior normal.        Thought Content: Thought content normal.        Judgment: Judgment normal.           Assessment & Plan:   Patricia Leon was seen today for back pain.  Diagnoses and all orders for this visit:  Routine adult health maintenance -     -     POCT urine pregnancy -     EKG 12-Lead -     POCT Urinalysis Dipstick (CPT 81002)  Muscle strain of right scapular  region, initial encounter  Other orders -     cyclobenzaprine (FLEXERIL) 10 MG tablet; Take 1 tablet (10 mg total) by mouth 3 (three) times daily as needed for muscle spasms. -     ibuprofen (ADVIL,MOTRIN) 800 MG tablet; Take 1 tablet (800 mg total) by mouth every 8 (eight) hours as needed for moderate pain.  Normal EKG.    Discussed side effects of above medications.   Gave and reviewed After Visit Summary(AVS) with patient. Patient is advised to read the after visit summary as well and let the provider know if any question, concerns or clarifications are needed.   Discussed RED Flags.   Advised patient call the office or your primary care doctor for an appointment if no improvement within 72 hours or if any symptoms change or worsen at any time  Advised ER or urgent Care if after hours or on weekend. Call 911 for emergency symptoms at any time.Patinet verbalized understanding of all instructions given/reviewed and treatment plan and has no further questions or concerns at this time.     Patient verbalized understanding of all instructions given and denies any further questions at this time.

## 2018-06-29 NOTE — Addendum Note (Signed)
Addended by: Karen Kays on: 06/29/2018 10:03 AM   Modules accepted: Level of Service

## 2018-07-01 ENCOUNTER — Encounter: Payer: Managed Care, Other (non HMO) | Admitting: Certified Nurse Midwife

## 2018-08-08 ENCOUNTER — Telehealth: Payer: Self-pay

## 2018-08-08 NOTE — Telephone Encounter (Signed)
Coronavirus (COVID-19) Are you at risk?  Are you at risk for the Coronavirus (COVID-19)?  To be considered HIGH RISK for Coronavirus (COVID-19), you have to meet the following criteria:  . Traveled to [place], [place], [place], [place] or [place]; or in the [place] to [place], [place], [place], or [place]; and have fever, cough, and shortness of breath within the last 2 weeks of travel OR . Been in close contact with a person diagnosed with COVID-19 within the last 2 weeks and have fever, cough, and shortness of breath . IF YOU DO NOT MEET THESE CRITERIA, YOU ARE CONSIDERED LOW RISK FOR COVID-19.  What to do if you are HIGH RISK for COVID-19?  . If you are having a medical emergency, call 911. . Seek medical care right away. Before you go to a doctor's office, urgent care or emergency department, call ahead and tell them about your recent travel, contact with someone diagnosed with COVID-19, and your symptoms. You should receive instructions from your physician's office regarding next steps of care.  . When you arrive at healthcare provider, tell the healthcare staff immediately you have returned from visiting [place], [place], [place], [place] or [place]; or traveled in the [place] to [place], [place], [place], or [place]; in the last two weeks or you have been in close contact with a person diagnosed with COVID-19 in the last 2 weeks.   . Tell the health care staff about your symptoms: fever, cough and shortness of breath. . After you have been seen by a medical provider, you will be either: o Tested for (COVID-19) and discharged home on quarantine except to seek medical care if symptoms worsen, and asked to  - Stay home and avoid contact with others until you get your results (4-5 days)  - Avoid travel on public transportation if possible (such as bus, train, or airplane) or o Sent to the Emergency Department by EMS for evaluation, COVID-19 testing, and possible  admission depending on your condition and test results.  What to do if you are LOW RISK for COVID-19?  Reduce your risk of any infection by using the same precautions used for avoiding the common cold or flu:  . Wash your hands often with soap and warm water for at least 20 seconds.  If soap and water are not readily available, use an alcohol-based hand sanitizer with at least 60% alcohol.  . If coughing or sneezing, cover your mouth and nose by coughing or sneezing into the elbow areas of your shirt or coat, into a tissue or into your sleeve (not your hands). . Avoid shaking hands with others and consider head nods or verbal greetings only. . Avoid touching your eyes, nose, or mouth with unwashed hands.  . Avoid close contact with people who are Bazil Dhanani. . Avoid places or events with large numbers of people in one location, like concerts or sporting events. . Carefully consider travel plans you have or are making. . If you are planning any travel outside or inside the [place], visit the CDC's Travelers' Health webpage for the latest health notices. . If you have some symptoms but not all symptoms, continue to monitor at home and seek medical attention if your symptoms worsen. . If you are having a medical emergency, call 911.  08/08/18 SCREENING NEG SLS ADDITIONAL HEALTHCARE OPTIONS FOR PATIENTS  [facility] / e-Visit: [email or web address]         [facility]: [phone]    [facility]: [phone]                   [facility]: [phone]  

## 2018-08-09 ENCOUNTER — Encounter: Payer: Self-pay | Admitting: Certified Nurse Midwife

## 2018-08-09 ENCOUNTER — Ambulatory Visit (INDEPENDENT_AMBULATORY_CARE_PROVIDER_SITE_OTHER): Payer: Managed Care, Other (non HMO) | Admitting: Certified Nurse Midwife

## 2018-08-09 ENCOUNTER — Other Ambulatory Visit: Payer: Self-pay

## 2018-08-09 VITALS — BP 111/66 | HR 75 | Ht 63.0 in | Wt 200.1 lb

## 2018-08-09 DIAGNOSIS — Z1239 Encounter for other screening for malignant neoplasm of breast: Secondary | ICD-10-CM

## 2018-08-09 DIAGNOSIS — N979 Female infertility, unspecified: Secondary | ICD-10-CM

## 2018-08-09 MED ORDER — CLOMIPHENE CITRATE 50 MG PO TABS: 50.0000 mg | ORAL_TABLET | Freq: Every day | ORAL | 1 refills | Status: AC

## 2018-08-09 NOTE — Progress Notes (Signed)
GYNECOLOGY ANNUAL PREVENTATIVE CARE ENCOUNTER NOTE  History:     Patricia Leon is a 43 y.o. G0P0000 female here for a routine annual gynecologic exam.  Current complaints: infertility.   Denies abnormal vaginal bleeding, discharge, pelvic pain, problems with intercourse or other gynecologic concerns.  She would like to begin work up for infertility .    Gynecologic History Patient's last menstrual period was 07/20/2018 (exact date). Contraception: none Last Pap: 06/19/16. Results were: normal with negative HPV Last mammogram: 08/2017. Results were: normal  Obstetric History OB History  Gravida Para Term Preterm AB Living  0 0 0 0 0 0  SAB TAB Ectopic Multiple Live Births  0 0 0 0 0    No past medical history on file.  No past surgical history on file.  Current Outpatient Medications on File Prior to Visit  Medication Sig Dispense Refill  . cyclobenzaprine (FLEXERIL) 10 MG tablet Take 1 tablet (10 mg total) by mouth 3 (three) times daily as needed for muscle spasms. 30 tablet 0  . ibuprofen (ADVIL,MOTRIN) 800 MG tablet Take 1 tablet (800 mg total) by mouth every 8 (eight) hours as needed for moderate pain. 30 tablet 0   No current facility-administered medications on file prior to visit.     Allergies  Allergen Reactions  . Sulfa Antibiotics Rash    Social History:  reports that she has never smoked. She has never used smokeless tobacco. She reports current alcohol use. She reports that she does not use drugs.  Family History  Problem Relation Age of Onset  . Varicose Veins Maternal Grandmother   . Heart disease Maternal Grandfather   . Breast cancer Maternal Aunt 39       1/2 sister by same father    The following portions of the patient's history were reviewed and updated as appropriate: allergies, current medications, past family history, past medical history, past social history, past surgical history and problem list.  Review of Systems Pertinent items noted in HPI  and remainder of comprehensive ROS otherwise negative.  Physical Exam:  BP 111/66   Pulse 75   Ht 5' 3"  (1.6 m)   Wt 200 lb 1 oz (90.7 kg)   LMP 07/20/2018 (Exact Date)   BMI 35.44 kg/m  CONSTITUTIONAL: Well-developed, well-nourished female in no acute distress.  HENT:  Normocephalic, atraumatic, External right and left ear normal. Oropharynx is clear and moist EYES: Conjunctivae and EOM are normal. Pupils are equal, round, and reactive to light. No scleral icterus.  NECK: Normal range of motion, supple, no masses.  Normal thyroid.  SKIN: Skin is warm and dry. No rash noted. Not diaphoretic. No erythema. No pallor. MUSCULOSKELETAL: Normal range of motion. No tenderness.  No cyanosis, clubbing, or edema.  2+ distal pulses. NEUROLOGIC: Alert and oriented to person, place, and time. Normal reflexes, muscle tone coordination. No cranial nerve deficit noted. PSYCHIATRIC: Normal mood and affect. Normal behavior. Normal judgment and thought content. CARDIOVASCULAR: Normal heart rate noted, regular rhythm RESPIRATORY: Clear to auscultation bilaterally. Effort and breath sounds normal, no problems with respiration noted. BREASTS: Symmetric in size. No masses, skin changes, nipple drainage, or lymphadenopathy. ABDOMEN: Soft, normal bowel sounds, no distention noted.  No tenderness, rebound or guarding.  PELVIC: Normal appearing external genitalia; normal appearing vaginal mucosa and cervix.  No abnormal discharge noted.  Pap smear obtained.  Normal uterine size, no other palpable masses, no uterine or adnexal tenderness.   Assessment and Plan:    Annual physical exam  Pap smear not indicated. Due 2023. Mammogram scheduled Referral to infertility U/s ordered for infertility Pt encouraged to have partner semen analysis completed. Information given on testing.  Encouraged ovulation Kit Clomid ordered, reviewed use , benefits and risks of use. Information sheet on use given.  Progesterone order  day 21/22 of cycle  Routine preventative health maintenance measures emphasized. Please refer to After Visit Summary for other counseling recommendations.   Deneise Lever Ryli Standlee,CNM

## 2018-08-09 NOTE — Patient Instructions (Signed)
Female Infertility    Female infertility refers to a woman's inability to get pregnant (conceive) after a year of having sex regularly (or after 6 months in women over age 43) without using birth control. Infertility can also mean that a woman is not able to carry a pregnancy to full term.  Both women and men can have fertility problems.  What are the causes?  This condition may be caused by:  Problems with reproductive organs. Infertility can result if a woman:  Has an abnormally short cervix or a cervix that does not remain closed during a pregnancy.  Has a blockage or scarring in the fallopian tubes.  Has an abnormally shaped uterus.  Has uterine fibroids. This is a benign mass of tissue or muscle (tumor) that can develop in the uterus.  Is not ovulating in a regular way.  Certain medical conditions. These may include:  Polycystic ovary syndrome (PCOS). This is a hormonal disorder that can cause small cysts to grow on the ovaries. This is the most common cause of infertility in women.  Endometriosis. This is a condition in which the tissue that lines the uterus (endometrium) grows outside of its normal location.  Cancer and cancer treatments, such as chemotherapy or radiation.  Premature ovarian failure. This is when ovaries stop producing eggs and hormones before age 40.  Sexually transmitted diseases, such as chlamydia or gonorrhea.  Autoimmune disorders. These are disorders in which the body's defense system (immune system) attacks normal, healthy cells.  Infertility can be linked to more than one cause. For some women, the cause of infertility is not known (unexplained infertility).  What increases the risk?  Age. A woman's fertility declines with age, especially after her mid-30s.  Being underweight or overweight.  Drinking too much alcohol.  Using drugs such as anabolic steroids, cocaine, and marijuana.  Exercising excessively.  Being exposed to environmental toxins, such as radiation, pesticides, and  certain chemicals.  What are the signs or symptoms?  The main sign of infertility in women is the inability to get pregnant or carry a pregnancy to full term.  How is this diagnosed?  This condition may be diagnosed by:  Checking whether you are ovulating each month. The tests may include:  Blood tests to check hormone levels.  An ultrasound of the ovaries.  Taking a small tissue that lines the uterus and checking it under a microscope (endometrial biopsy).  Doing additional tests. This is done if ovulation is normal. Tests may include:  Hysterosalpingography. This X-ray test can show the shape of the uterus and whether the fallopian tubes are open.  Laparoscopy. This test uses a lighted tube (laparoscope) to look for problems in the fallopian tubes and other organs.  Transvaginal ultrasound. This imaging test is used to check for abnormalities in the uterus and ovaries.  Hysteroscopy. This test uses a lighted tube to check for problems in the cervix and the uterus.  To be diagnosed with infertility, both partners will have a physical exam. Both partners will also have an extensive medical and sexual history taken. Additional tests may be done.  How is this treated?  Treatment depends on the cause of infertility. Most cases of infertility in women are treated with medicine or surgery.  Women may take medicine to:  Correct ovulation problems.  Treat other health conditions.  Surgery may be done to:  Repair damage to the ovaries, fallopian tubes, cervix, or uterus.  Remove growths from the uterus.  Remove scar tissue   from the uterus, pelvis, or other organs.  Assisted reproductive technology (ART)  Assisted reproductive technology (ART) refers to all treatments and procedures that combine eggs and sperm outside the body to try to help a couple conceive. ART is often combined with fertility drugs to stimulate ovulation. Sometimes ART is done using eggs retrieved from another woman's body (donor eggs) or from previously  frozen fertilized eggs (embryos).  There are different types of ART. These include:  Intrauterine insemination (IUI). A long, thin tube is used to place sperm directly into a woman's uterus. This procedure:  Is effective for infertility caused by sperm problems, including low sperm count and low motility.  Can be used in combination with fertility drugs.  In vitro fertilization (IVF). This is done when a woman's fallopian tubes are blocked or when a man has low sperm count. In this procedure:  Fertility drugs are used to stimulate the ovaries to produce multiple eggs.  Once mature, these eggs are removed from the body and combined with the sperm to be fertilized.  The fertilized eggs are then placed into the woman's uterus.  Follow these instructions at home:  Take over-the-counter and prescription medicines only as told by your health care provider.  Do not use any products that contain nicotine or tobacco, such as cigarettes and e-cigarettes. If you need help quitting, ask your health care provider.  If you drink alcohol, limit how much you have to 1 drink a day.  Make dietary changes to lose weight or maintain a healthy weight. Work with your health care provider and a dietitian to set a weight-loss goal that is healthy and reasonable for you.  Seek support from a counselor or support group to talk about your concerns related to infertility. Couples counseling may be helpful for you and your partner.  Practice stress reduction techniques that work well for you, such as regular physical activity, meditation, or deep breathing.  Keep all follow-up visits as told by your health care provider. This is important.  Contact a health care provider if you:  Feel that stress is interfering with your life and relationships.  Have side effects from treatments for infertility.  Summary  Female infertility refers to a woman's inability to get pregnant (conceive) after a year of having sex regularly (or after 6 months in women  over age 43) without using birth control.  To be diagnosed with infertility, both partners will have a physical exam. Both partners will also have an extensive medical and sexual history taken.  Seek support from a counselor or support group to talk about your concerns related to infertility. Couples counseling may be helpful for you and your partner.  This information is not intended to replace advice given to you by your health care provider. Make sure you discuss any questions you have with your health care provider.  Document Released: 02/26/2003 Document Revised: 01/25/2017 Document Reviewed: 01/25/2017  [employer]vier Interactive Patient Education © 2019 [employer]  •

## 2018-08-15 ENCOUNTER — Telehealth: Payer: Self-pay

## 2018-08-15 NOTE — Telephone Encounter (Signed)
Coronavirus (COVID-19) Are you at risk?  Are you at risk for the Coronavirus (COVID-19)?  To be considered HIGH RISK for Coronavirus (COVID-19), you have to meet the following criteria:  . Traveled to [place], [place], [place], [place] or [place]; or in the [place] to [place], [place], [place], or [place]; and have fever, cough, and shortness of breath within the last 2 weeks of travel OR . Been in close contact with a person diagnosed with COVID-19 within the last 2 weeks and have fever, cough, and shortness of breath . IF YOU DO NOT MEET THESE CRITERIA, YOU ARE CONSIDERED LOW RISK FOR COVID-19.  What to do if you are HIGH RISK for COVID-19?  . If you are having a medical emergency, call 911. . Seek medical care right away. Before you go to a doctor's office, urgent care or emergency department, call ahead and tell them about your recent travel, contact with someone diagnosed with COVID-19, and your symptoms. You should receive instructions from your physician's office regarding next steps of care.  . When you arrive at healthcare provider, tell the healthcare staff immediately you have returned from visiting [place], [place], [place], [place] or [place]; or traveled in the [place] to [place], [place], [place], or [place]; in the last two weeks or you have been in close contact with a person diagnosed with COVID-19 in the last 2 weeks.   . Tell the health care staff about your symptoms: fever, cough and shortness of breath. . After you have been seen by a medical provider, you will be either: o Tested for (COVID-19) and discharged home on quarantine except to seek medical care if symptoms worsen, and asked to  - Stay home and avoid contact with others until you get your results (4-5 days)  - Avoid travel on public transportation if possible (such as bus, train, or airplane) or o Sent to the Emergency Department by EMS for evaluation, COVID-19 testing, and possible  admission depending on your condition and test results.  What to do if you are LOW RISK for COVID-19?  Reduce your risk of any infection by using the same precautions used for avoiding the common cold or flu:  . Wash your hands often with soap and warm water for at least 20 seconds.  If soap and water are not readily available, use an alcohol-based hand sanitizer with at least 60% alcohol.  . If coughing or sneezing, cover your mouth and nose by coughing or sneezing into the elbow areas of your shirt or coat, into a tissue or into your sleeve (not your hands). . Avoid shaking hands with others and consider head nods or verbal greetings only. . Avoid touching your eyes, nose, or mouth with unwashed hands.  . Avoid close contact with people who are sick. . Avoid places or events with large numbers of people in one location, like concerts or sporting events. . Carefully consider travel plans you have or are making. . If you are planning any travel outside or inside the [place], visit the CDC's Travelers' Health webpage for the latest health notices. . If you have some symptoms but not all symptoms, continue to monitor at home and seek medical attention if your symptoms worsen. . If you are having a medical emergency, call 911.   ADDITIONAL HEALTHCARE OPTIONS FOR PATIENTS  [facility] Tele[facility] / e-Visit: [email or web address]         [facility]: [phone]  [facility] [facility]: [phone]                   [facility]: [phone]   Prescreened. Neg .cm 

## 2018-08-16 ENCOUNTER — Other Ambulatory Visit: Payer: Self-pay | Admitting: Certified Nurse Midwife

## 2018-08-16 ENCOUNTER — Other Ambulatory Visit: Payer: Self-pay

## 2018-08-16 ENCOUNTER — Ambulatory Visit (INDEPENDENT_AMBULATORY_CARE_PROVIDER_SITE_OTHER): Payer: Managed Care, Other (non HMO)

## 2018-08-16 DIAGNOSIS — Z319 Encounter for procreative management, unspecified: Secondary | ICD-10-CM | POA: Diagnosis not present

## 2018-08-25 ENCOUNTER — Telehealth: Payer: Self-pay | Admitting: Certified Nurse Midwife

## 2018-08-25 NOTE — Telephone Encounter (Signed)
The patient called and stated that she needs to speak with her nurse in regards to her wanting to know if she needs to cancel her mammogram that is scheduled before her Progesterone apt. Please advise.

## 2018-08-29 NOTE — Telephone Encounter (Signed)
Patricia Leon, not sure why she would think she cant have her mammogram done and her lab work for the progesterone level?? Please tell her she does not need to cancel the mammogram.    Thanks,  Deneise Lever

## 2018-09-02 ENCOUNTER — Telehealth: Payer: Self-pay

## 2018-09-02 ENCOUNTER — Ambulatory Visit
Admission: RE | Admit: 2018-09-02 | Discharge: 2018-09-02 | Disposition: A | Discharge status: Home / Self Care | Payer: Managed Care, Other (non HMO) | Source: Ambulatory Visit | Attending: Certified Nurse Midwife | Admitting: Certified Nurse Midwife

## 2018-09-02 ENCOUNTER — Other Ambulatory Visit: Payer: Self-pay

## 2018-09-02 DIAGNOSIS — Z1231 Encounter for screening mammogram for malignant neoplasm of breast: Secondary | ICD-10-CM | POA: Diagnosis not present

## 2018-09-02 DIAGNOSIS — Z1239 Encounter for other screening for malignant neoplasm of breast: Secondary | ICD-10-CM

## 2018-09-02 NOTE — Telephone Encounter (Signed)
Coronavirus (COVID-19) Are you at risk?  Are you at risk for the Coronavirus (COVID-19)?  To be considered HIGH RISK for Coronavirus (COVID-19), you have to meet the following criteria:  . Traveled to [place], [place], [place], [place] or [place]; or in the [place] to [place], [place], [place], or [place]; and have fever, cough, and shortness of breath within the last 2 weeks of travel OR . Been in close contact with a person diagnosed with COVID-19 within the last 2 weeks and have fever, cough, and shortness of breath . IF YOU DO NOT MEET THESE CRITERIA, YOU ARE CONSIDERED LOW RISK FOR COVID-19.  What to do if you are HIGH RISK for COVID-19?  . If you are having a medical emergency, call 911. . Seek medical care right away. Before you go to a doctor's office, urgent care or emergency department, call ahead and tell them about your recent travel, contact with someone diagnosed with COVID-19, and your symptoms. You should receive instructions from your physician's office regarding next steps of care.  . When you arrive at healthcare provider, tell the healthcare staff immediately you have returned from visiting [place], [place], [place], [place] or [place]; or traveled in the [place] to [place], [place], [place], or [place]; in the last two weeks or you have been in close contact with a person diagnosed with COVID-19 in the last 2 weeks.   . Tell the health care staff about your symptoms: fever, cough and shortness of breath. . After you have been seen by a medical provider, you will be either: o Tested for (COVID-19) and discharged home on quarantine except to seek medical care if symptoms worsen, and asked to  - Stay home and avoid contact with others until you get your results (4-5 days)  - Avoid travel on public transportation if possible (such as bus, train, or airplane) or o Sent to the Emergency Department by EMS for evaluation, COVID-19 testing, and possible  admission depending on your condition and test results.  What to do if you are LOW RISK for COVID-19?  Reduce your risk of any infection by using the same precautions used for avoiding the common cold or flu:  . Wash your hands often with soap and warm water for at least 20 seconds.  If soap and water are not readily available, use an alcohol-based hand sanitizer with at least 60% alcohol.  . If coughing or sneezing, cover your mouth and nose by coughing or sneezing into the elbow areas of your shirt or coat, into a tissue or into your sleeve (not your hands). . Avoid shaking hands with others and consider head nods or verbal greetings only. . Avoid touching your eyes, nose, or mouth with unwashed hands.  . Avoid close contact with people who are sick. . Avoid places or events with large numbers of people in one location, like concerts or sporting events. . Carefully consider travel plans you have or are making. . If you are planning any travel outside or inside the [place], visit the CDC's Travelers' Health webpage for the latest health notices. . If you have some symptoms but not all symptoms, continue to monitor at home and seek medical attention if your symptoms worsen. . If you are having a medical emergency, call 911.   ADDITIONAL HEALTHCARE OPTIONS FOR PATIENTS  [facility] Tele[facility] / e-Visit: [email or web address]         [facility]: [phone]  [facility]: [phone]                   [facility]: [phone]   Prescreened. Neg .cm 

## 2018-09-05 ENCOUNTER — Other Ambulatory Visit: Payer: Managed Care, Other (non HMO)

## 2018-09-05 ENCOUNTER — Other Ambulatory Visit: Payer: Self-pay

## 2018-09-05 DIAGNOSIS — N979 Female infertility, unspecified: Secondary | ICD-10-CM

## 2018-09-06 LAB — PROGESTERONE: Progesterone: 12 ng/mL

## 2018-12-30 ENCOUNTER — Other Ambulatory Visit: Payer: Self-pay

## 2018-12-30 ENCOUNTER — Encounter: Payer: Self-pay | Admitting: Adult Health

## 2018-12-30 ENCOUNTER — Ambulatory Visit: Payer: Managed Care, Other (non HMO) | Admitting: Adult Health

## 2018-12-30 VITALS — BP 118/70 | HR 72 | Temp 98.1°F | Resp 16 | Ht 64.0 in | Wt 197.0 lb

## 2018-12-30 DIAGNOSIS — Z008 Encounter for other general examination: Secondary | ICD-10-CM | POA: Diagnosis not present

## 2018-12-30 NOTE — Progress Notes (Signed)
[facility]  Mashayla Lavin DOB: 43 y.o. MRN: [id number]  Subjective:  Here for Biometric Screen/brief exam Patient is a 43 year old female in no acute distress who comes in the clinic for her biometric screening and brief exam for her employer [employer].  She has no concerns at today's visit.  She has had a flu shot this year. Patient  denies any fever, body aches,chills, rash, chest pain, shortness of breath, nausea, vomiting, or diarrhea.    Allergies  Allergen Reactions  . Sulfa Antibiotics Rash   There are no active problems to display for this patient.   Current Outpatient Medications:  .  cyclobenzaprine (FLEXERIL) 10 MG tablet, Take 1 tablet (10 mg total) by mouth 3 (three) times daily as needed for muscle spasms., Disp: 30 tablet, Rfl: 0 .  ibuprofen (ADVIL,MOTRIN) 800 MG tablet, Take 1 tablet (800 mg total) by mouth every 8 (eight) hours as needed for moderate pain., Disp: 30 tablet, Rfl: 0  Objective :  Blood pressure 118/70, pulse 72, temperature 98.1 F (36.7 C), temperature source Temporal, resp. rate 16, height 5\' 4"  (1.626 m), weight 197 lb (89.4 kg), SpO2 98 %. Body mass index is 33.81 kg/m.  NAD, well-developed well-nourished, Patient is alert and oriented and responsive to questions Engages in eye contact with provider. Speaks in full sentences without any pauses without any shortness of breath or distress.  HEENT: Within normal limits Neck: Normal, supple, no cervical lymphadenopathy, thyroid normal Heart: Regular rate and rhythm without murmurs rubs or gallops Lungs: Clear to auscultation without any adventitious lung sounds Neuro:Patient moves on and off of exam table and in room without difficulty. Gait is normal in hall and in room.   Assessment: Biometric screen Encounter for biometric screening  Encounter for other general examination-not a full annual physical-biometric screening with  brief exam only    Plan: The Biometric exam is a brief physical with labs including glucose and cholesterol. This does not replace a full physical with a primary care provider, and additional recommended labs. This is an acute care clinic not for maintenance of chronic or long standing conditions.   Provider also recommends if you do not have a primary care provider for patient to establish care promptly.You can choose any provider of your choice at any facility of your choice, the below information is  just a resource to aid in you finding a primary care provider for routine health maintenance.   [facility]  PHYSICIAN/PROVIDER  REFERRAL LINE at [phone]  [email or web address] to help assist with finding a primary care doctor.   Helpful resources below of other primary care office's accepting new patients.   [facility]         [address]  [place]. [place] [address] [phone]  . [facility]    [address] [place], [place] [address] [phone]  . [facility] [address] [place], [place]  [phone]   . [facility]  [address] [place] [address] [phone]    Follow up with primary care as needed for chronic and maintenance health care- can be seen in this employee clinic for acute care.   Fasting glucose and lipids. Discussed with patient that today's visit here is a limited biometric screening visit (not a comprehensive exam or management of any chronic problems) Discussed some health issues,  including healthy eating habits and exercise. Encouraged to follow-up with PCP for annual comprehensive preventive and wellness care (and if applicable, any chronic issues). Questions invited and answered.

## 2018-12-30 NOTE — Patient Instructions (Signed)
The Biometric exam is a brief physical with labs including glucose and cholesterol. This does not replace a full physical with a primary care provider, and additional recommended labs. This is an acute care clinic not for maintenance of chronic or long standing conditions.   Provider also recommends if you do not have a primary care provider for patient to establish care promptly.You can choose any provider of your choice at any facility of your choice, the below information is  just a resource to aid in you finding a primary care provider for routine health maintenance.   [facility]  PHYSICIAN/PROVIDER  REFERRAL LINE at [phone]  [email or web address] to help assist with finding a primary care doctor.   Helpful resources below of other primary care office's accepting new patients.   [facility]ie Macadamia. [facility]         [address]  [place]. [place] [address] [phone]) [phone]  . [facility]    [address] [place], [place] [address] [phone]  . [facility] [address]  [place], [place]  [phone]   . [facility]  [address], [address] [place] [address] [phone]) [phone]    Follow up with primary care as needed for chronic and maintenance health care- can be seen in this employee clinic for acute care.    Provider also recommends patient see primary care physician for a routine physical and to establish primary care. Patient may chose provider of choice. Also gave the [facility]  PHYSICIAN REFERRAL LINE at [phone] or web site at [email or web address] to help assist with finding a primary care doctor. Patient understands this office is acute care office only and no primary care is done at this office.    Health Maintenance, Female Adopting a healthy lifestyle and getting preventive care are important in promoting health and wellness. Ask your health care  provider about:  The right schedule for you to have regular tests and exams.  Things you can do on your own to prevent diseases and keep yourself healthy. What should I know about diet, weight, and exercise? Eat a healthy diet   Eat a diet that includes plenty of vegetables, fruits, low-fat dairy products, and lean protein.  Do not eat a lot of foods that are high in solid fats, added sugars, or sodium. Maintain a healthy weight Body mass index (BMI) is used to identify weight problems. It estimates body fat based on height and weight. Your health care provider can help determine your BMI and help you achieve or maintain a healthy weight. Get regular exercise Get regular exercise. This is one of the most important things you can do for your health. Most adults should:  Exercise for at least 150 minutes each week. The exercise should increase your heart rate and make you sweat (moderate-intensity exercise).  Do strengthening exercises at least twice a week. This is in addition to the moderate-intensity exercise.  Spend less time sitting. Even light physical activity can be beneficial. Watch cholesterol and blood lipids Have your blood tested for lipids and cholesterol at 43 years of age, then have this test every 5 years. Have your cholesterol levels checked more often if:  Your lipid or cholesterol levels are high.  You are older than 43 years of age.  You are at high risk for heart disease. What should I know about cancer screening? Depending on your health history  and family history, you may need to have cancer screening at various ages. This may include screening for:  Breast cancer.  Cervical cancer.  Colorectal cancer.  Skin cancer.  Lung cancer. What should I know about heart disease, diabetes, and high blood pressure? Blood pressure and heart disease  High blood pressure causes heart disease and increases the risk of stroke. This is more likely to develop in  people who have high blood pressure readings, are of African descent, or are overweight.  Have your blood pressure checked: ? Every 3-5 years if you are 55-47 years of age. ? Every year if you are 27 years old or older. Diabetes Have regular diabetes screenings. This checks your fasting blood sugar level. Have the screening done:  Once every three years after age 1 if you are at a normal weight and have a low risk for diabetes.  More often and at a younger age if you are overweight or have a high risk for diabetes. What should I know about preventing infection? Hepatitis B If you have a higher risk for hepatitis B, you should be screened for this virus. Talk with your health care provider to find out if you are at risk for hepatitis B infection. Hepatitis C Testing is recommended for:  Everyone born from [birth year] through 1965.  Anyone with known risk factors for hepatitis C. Sexually transmitted infections (STIs)  Get screened for STIs, including gonorrhea and chlamydia, if: ? You are sexually active and are younger than 43 years of age. ? You are older than 43 years of age and your health care provider tells you that you are at risk for this type of infection. ? Your sexual activity has changed since you were last screened, and you are at increased risk for chlamydia or gonorrhea. Ask your health care provider if you are at risk.  Ask your health care provider about whether you are at high risk for HIV. Your health care provider may recommend a prescription medicine to help prevent HIV infection. If you choose to take medicine to prevent HIV, you should first get tested for HIV. You should then be tested every 3 months for as long as you are taking the medicine. Pregnancy  If you are about to stop having your period (premenopausal) and you may become pregnant, seek counseling before you get pregnant.  Take 400 to 800 micrograms (mcg) of folic acid every day if you become pregnant.   Ask for birth control (contraception) if you want to prevent pregnancy. Osteoporosis and menopause Osteoporosis is a disease in which the bones lose minerals and strength with aging. This can result in bone fractures. If you are 65 years old or older, or if you are at risk for osteoporosis and fractures, ask your health care provider if you should:  Be screened for bone loss.  Take a calcium or vitamin D supplement to lower your risk of fractures.  Be given hormone replacement therapy (HRT) to treat symptoms of menopause. Follow these instructions at home: Lifestyle  Do not use any products that contain nicotine or tobacco, such as cigarettes, e-cigarettes, and chewing tobacco. If you need help quitting, ask your health care provider.  Do not use street drugs.  Do not share needles.  Ask your health care provider for help if you need support or information about quitting drugs. Alcohol use  Do not drink alcohol if: ? Your health care provider tells you not to drink. ? You are pregnant, may be  pregnant, or are planning to become pregnant.  If you drink alcohol: ? Limit how much you use to 0-1 drink a day. ? Limit intake if you are breastfeeding.  Be aware of how much alcohol is in your drink. In the [place] one drink equals one 12 oz bottle of beer (355 mL), one 5 oz glass of wine (148 mL), or one 1 oz glass of hard liquor (44 mL). General instructions  Schedule regular health, dental, and eye exams.  Stay current with your vaccines.  Tell your health care provider if: ? You often feel depressed. ? You have ever been abused or do not feel safe at home. Summary  Adopting a healthy lifestyle and getting preventive care are important in promoting health and wellness.  Follow your health care provider's instructions about healthy diet, exercising, and getting tested or screened for diseases.  Follow your health care provider's instructions on monitoring your cholesterol and  blood pressure. This information is not intended to replace advice given to you by your health care provider. Make sure you discuss any questions you have with your health care provider. Document Released: 09/08/2010 Document Revised: 02/16/2018 Document Reviewed: 02/16/2018 Elsevier Patient Education  2020 [employer].

## 2018-12-31 LAB — GLUCOSE, RANDOM: Glucose: 87 mg/dL (ref 65–99)

## 2018-12-31 LAB — LIPID PANEL
Chol/HDL Ratio: 4.1 ratio (ref 0.0–4.4)
Cholesterol, Total: 152 mg/dL (ref 100–199)
HDL: 37 mg/dL — ABNORMAL LOW (ref >39)
LDL Chol Calc (NIH): 97 mg/dL (ref 0–99)
Triglycerides: 93 mg/dL (ref 0–149)
VLDL Cholesterol Cal: 18 mg/dL (ref 5–40)

## 2019-01-18 ENCOUNTER — Encounter: Payer: Self-pay | Admitting: Adult Health

## 2019-10-06 ENCOUNTER — Encounter: Payer: Self-pay | Admitting: Nurse Practitioner

## 2019-10-06 ENCOUNTER — Ambulatory Visit: Payer: Managed Care, Other (non HMO) | Admitting: Nurse Practitioner

## 2019-10-06 ENCOUNTER — Other Ambulatory Visit: Payer: Self-pay

## 2019-10-06 VITALS — BP 136/72 | HR 74 | Temp 98.4°F | Resp 16 | Ht 63.0 in | Wt 208.0 lb

## 2019-10-06 DIAGNOSIS — L237 Allergic contact dermatitis due to plants, except food: Secondary | ICD-10-CM

## 2019-10-06 MED ORDER — PREDNISONE 5 MG (21) PO TBPK: ORAL_TABLET | ORAL | 0 refills | Status: DC

## 2019-10-06 MED ORDER — TRIAMCINOLONE ACETONIDE 0.1 % EX CREA: 1.0000 "application " | TOPICAL_CREAM | Freq: Two times a day (BID) | CUTANEOUS | 0 refills | Status: DC

## 2019-10-06 NOTE — Patient Instructions (Signed)
Poison Oak Dermatitis  Poison oak dermatitis is redness and soreness (inflammation) of the skin caused by chemicals in the leaves of the poison oak plant. You may have very bad itching, swelling, a rash, and blisters. What are the causes? You may get this condition by:  Touching a poison oak plant.  Touching something that has the chemical from the leaves on it. This may include animals or objects that have come in contact with the plant. What increases the risk? You are more likely to get this condition if you:  Go outdoors often in wooded or marshy areas.  Go outdoors without wearing protective clothing, such as closed shoes, long pants, and a long-sleeved shirt. What are the signs or symptoms? Symptoms of this condition include:  Redness of the skin.  Very bad itching.  A rash that often includes bumps and blisters. ? The rash usually appears 48 hours after exposure if you have been exposed before. ? If this is the first time you have been exposed, the rash may not appear until a week after exposure.  Swelling. This may occur if the reaction is very bad. Symptoms often clear up in 1-2 weeks. The first time you develop this condition, symptoms may last 3-4 weeks. How is this treated? This condition may be treated with:  Hydrocortisone creams or calamine lotions to help with itching.  Oatmeal baths to soothe the skin.  Medicines to help reduce itching (antihistamines). If you have a very bad reaction, you may also be given steroid medicines. Follow these instructions at home: Medicines  Take or apply over-the-counter and prescription medicines only as told by your doctor.  Use hydrocortisone creams or calamine lotion as needed to help with itching. General instructions  Do not scratch or rub your skin.  Put a cold, wet cloth (cold compress) on the affected areas or take baths in cool water. This will help with itching.  Avoid hot baths and showers.  Take oatmeal  baths as needed. Use colloidal oatmeal. You can get this at a pharmacy or grocery store. Follow the instructions on the package.  While you have the rash, wash your clothes right after you wear them.  Keep all follow-up visits as told by your doctor. This is important. How is this prevented?   Know what poison oak looks like so you can avoid it. ? This plant has three leaves with flowering branches on a single stem. ? The leaves are fuzzy. ? The edges of the leaves look like teeth.  If you have touched poison oak, wash your skin with soap and water right away. Be sure to wash under your fingernails.  When hiking or camping, wear long pants, a long-sleeved shirt, tall socks, and hiking boots. You can also use a lotion on your skin that helps to prevent contact with the chemical on the plant.  If you think that your clothes or outdoor gear came in contact with poison oak, rinse them off with a garden hose before you bring them inside your house.  When doing yard work or gardening, wear gloves, long sleeves, long pants, and boots. Wash your garden tools and gloves if they come in contact with poison oak.  If you think that your pet has come into contact with poison oak, wash him or her with pet shampoo and water. Make sure you wear gloves while washing your pet.  Do not burn poison oak plants. This can release the chemical from the plant into the air and   may cause a reaction. Contact a doctor if:  You have open sores in the rash area.  You have more redness, swelling, or pain in the affected area.  You have redness that spreads beyond the rash area.  You have fluid, blood, or pus coming from the affected area.  You have a fever.  You have a rash over a large area of your body.  You have a rash on your eyes, mouth, or genitals.  Your rash does not improve after a few weeks. Get help right away if:  Your face swells or your eyes swell shut.  You have trouble breathing.  You  have trouble swallowing. These symptoms may be an emergency. Do not wait to see if the symptoms will go away. Get medical help right away. Call your local emergency services (911 in the [place] Do not drive yourself to the hospital. Summary  Poison oak dermatitis is redness and soreness of the skin caused by chemicals in the leaves of the poison oak plant.  Symptoms of this condition include redness, very bad itching, a rash, and swelling.  Do not scratch or rub your skin.  Take or apply over-the-counter and prescription medicines only as told by your doctor. This information is not intended to replace advice given to you by your health care provider. Make sure you discuss any questions you have with your health care provider. Document Revised: 06/17/2018 Document Reviewed: 03/25/2018 Elsevier Patient Education  2020 [employer] Inc[employer]  Contact Dermatitis Dermatitis is redness, soreness, and swelling (inflammation) of the skin. Contact dermatitis is a reaction to something that touches the skin. There are two types of contact dermatitis:  Irritant contact dermatitis. This happens when something bothers (irritates) your skin, like soap.  Allergic contact dermatitis. This is caused when you are exposed to something that you are allergic to, such as poison ivy. What are the causes?  Common causes of irritant contact dermatitis include: ? Makeup. ? Soaps. ? Detergents. ? Bleaches. ? Acids. ? Metals, such as nickel.  Common causes of allergic contact dermatitis include: ? Plants. ? Chemicals. ? Jewelry. ? Latex. ? Medicines. ? Preservatives in products, such as clothing. What increases the risk?  Having a job that exposes you to things that bother your skin.  Having asthma or eczema. What are the signs or symptoms? Symptoms may happen anywhere the irritant has touched your skin. Symptoms include:  Dry or flaky skin.  Redness.  Cracks.  Itching.  Pain or a burning  feeling.  Blisters.  Blood or clear fluid draining from skin cracks. With allergic contact dermatitis, swelling may occur. This may happen in places such as the eyelids, mouth, or genitals. How is this treated?  This condition is treated by checking for the cause of the reaction and protecting your skin. Treatment may also include: ? Steroid creams, ointments, or medicines. ? Antibiotic medicines or other ointments, if you have a skin infection. ? Lotion or medicines to help with itching. ? A bandage (dressing). Follow these instructions at home: Skin care  Moisturize your skin as needed.  Put cool cloths on your skin.  Put a baking soda paste on your skin. Stir water into baking soda until it looks like a paste.  Do not scratch your skin.  Avoid having things rub up against your skin.  Avoid the use of soaps, perfumes, and dyes. Medicines  Take or apply over-the-counter and prescription medicines only as told by your doctor.  If you were prescribed an  antibiotic medicine, take or apply it as told by your doctor. Do not stop using it even if your condition starts to get better. Bathing  Take a bath with: ? Epsom salts. ? Baking soda. ? Colloidal oatmeal.  Bathe less often.  Bathe in warm water. Avoid using hot water. Bandage care  If you were given a bandage, change it as told by your health care provider.  Wash your hands with soap and water before and after you change your bandage. If soap and water are not available, use hand sanitizer. General instructions  Avoid the things that caused your reaction. If you do not know what caused it, keep a journal. Write down: ? What you eat. ? What skin products you use. ? What you drink. ? What you wear in the area that has symptoms. This includes jewelry.  Check the affected areas every day for signs of infection. Check for: ? More redness, swelling, or pain. ? More fluid or blood. ? Warmth. ? Pus or a bad  smell.  Keep all follow-up visits as told by your doctor. This is important. Contact a doctor if:  You do not get better with treatment.  Your condition gets worse.  You have signs of infection, such as: ? More swelling. ? Tenderness. ? More redness. ? Soreness. ? Warmth.  You have a fever.  You have new symptoms. Get help right away if:  You have a very bad headache.  You have neck pain.  Your neck is stiff.  You throw up (vomit).  You feel very sleepy.  You see red streaks coming from the area.  Your bone or joint near the area hurts after the skin has healed.  The area turns darker.  You have trouble breathing. Summary  Dermatitis is redness, soreness, and swelling of the skin.  Symptoms may occur where the irritant has touched you.  Treatment may include medicines and skin care.  If you do not know what caused your reaction, keep a journal.  Contact a doctor if your condition gets worse or you have signs of infection. This information is not intended to replace advice given to you by your health care provider. Make sure you discuss any questions you have with your health care provider. Document Revised: 06/15/2018 Document Reviewed: 09/08/2017 Elsevier Patient Education  2020 Elsevier Inc.  Contact Dermatitis Dermatitis is redness, soreness, and swelling (inflammation) of the skin. Contact dermatitis is a reaction to something that touches the skin. There are two types of contact dermatitis:  Irritant contact dermatitis. This happens when something bothers (irritates) your skin, like soap.  Allergic contact dermatitis. This is caused when you are exposed to something that you are allergic to, such as poison ivy. What are the causes?  Common causes of irritant contact dermatitis include: ? Makeup. ? Soaps. ? Detergents. ? Bleaches. ? Acids. ? Metals, such as nickel.  Common causes of allergic contact dermatitis  include: ? Plants. ? Chemicals. ? Jewelry. ? Latex. ? Medicines. ? Preservatives in products, such as clothing. What increases the risk?  Having a job that exposes you to things that bother your skin.  Having asthma or eczema. What are the signs or symptoms? Symptoms may happen anywhere the irritant has touched your skin. Symptoms include:  Dry or flaky skin.  Redness.  Cracks.  Itching.  Pain or a burning feeling.  Blisters.  Blood or clear fluid draining from skin cracks. With allergic contact dermatitis, swelling may occur. This may happen  in places such as the eyelids, mouth, or genitals. How is this treated?  This condition is treated by checking for the cause of the reaction and protecting your skin. Treatment may also include: ? Steroid creams, ointments, or medicines. ? Antibiotic medicines or other ointments, if you have a skin infection. ? Lotion or medicines to help with itching. ? A bandage (dressing). Follow these instructions at home: Skin care  Moisturize your skin as needed.  Put cool cloths on your skin.  Put a baking soda paste on your skin. Stir water into baking soda until it looks like a paste.  Do not scratch your skin.  Avoid having things rub up against your skin.  Avoid the use of soaps, perfumes, and dyes. Medicines  Take or apply over-the-counter and prescription medicines only as told by your doctor.  If you were prescribed an antibiotic medicine, take or apply it as told by your doctor. Do not stop using it even if your condition starts to get better. Bathing  Take a bath with: ? Epsom salts. ? Baking soda. ? Colloidal oatmeal.  Bathe less often.  Bathe in warm water. Avoid using hot water. Bandage care  If you were given a bandage, change it as told by your health care provider.  Wash your hands with soap and water before and after you change your bandage. If soap and water are not available, use hand  sanitizer. General instructions  Avoid the things that caused your reaction. If you do not know what caused it, keep a journal. Write down: ? What you eat. ? What skin products you use. ? What you drink. ? What you wear in the area that has symptoms. This includes jewelry.  Check the affected areas every day for signs of infection. Check for: ? More redness, swelling, or pain. ? More fluid or blood. ? Warmth. ? Pus or a bad smell.  Keep all follow-up visits as told by your doctor. This is important. Contact a doctor if:  You do not get better with treatment.  Your condition gets worse.  You have signs of infection, such as: ? More swelling. ? Tenderness. ? More redness. ? Soreness. ? Warmth.  You have a fever.  You have new symptoms. Get help right away if:  You have a very bad headache.  You have neck pain.  Your neck is stiff.  You throw up (vomit).  You feel very sleepy.  You see red streaks coming from the area.  Your bone or joint near the area hurts after the skin has healed.  The area turns darker.  You have trouble breathing. Summary  Dermatitis is redness, soreness, and swelling of the skin.  Symptoms may occur where the irritant has touched you.  Treatment may include medicines and skin care.  If you do not know what caused your reaction, keep a journal.  Contact a doctor if your condition gets worse or you have signs of infection. This information is not intended to replace advice given to you by your health care provider. Make sure you discuss any questions you have with your health care provider. Document Revised: 06/15/2018 Document Reviewed: 09/08/2017 Elsevier Patient Education  2020 [employer]  Poison Ivy Dermatitis Poison ivy dermatitis is redness and soreness of the skin caused by chemicals in the leaves of the poison ivy plant. You may have very bad itching, swelling, a rash, and blisters. What are the causes?  Touching a  poison ivy plant.  Touching  something that has the chemical on it. This may include animals or objects that have come in contact with the plant. What increases the risk?  Going outdoors often in wooded or [place] areas.  Going outdoors without wearing protective clothing, such as closed shoes, long pants, and a long-sleeved shirt. What are the signs or symptoms?   Skin redness.  Very bad itching.  A rash that often includes bumps and blisters. ? The rash usually appears 48 hours after exposure, if you have been exposed before. ? If this is the first time you have been exposed, the rash may not appear until a week after exposure.  Swelling. This may occur if the reaction is very bad. Symptoms usually last for 1-2 weeks. The first time you develop this condition, symptoms may last 3-4 weeks. How is this treated? This condition may be treated with:  Hydrocortisone cream or calamine lotion to relieve itching.  Oatmeal baths to soothe the skin.  Medicines, such as over-the-counter antihistamine tablets.  Oral steroid medicine for more severe reactions. Follow these instructions at home: Medicines  Take or apply over-the-counter and prescription medicines only as told by your doctor.  Use hydrocortisone cream or calamine lotion as needed to help with itching. General instructions  Do not scratch or rub your skin.  Put a cold, wet cloth (cold compress) on the affected areas or take baths in cool water. This will help with itching.  Avoid hot baths and showers.  Take oatmeal baths as needed. Use colloidal oatmeal. You can get this at a pharmacy or grocery store. Follow the instructions on the package.  While you have the rash, wash your clothes right after you wear them.  Keep all follow-up visits as told by your health care provider. This is important. How is this prevented?   Know what poison ivy looks like, so you can avoid it. ? This plant has three leaves with  flowering branches on a single stem. ? The leaves are glossy. ? The leaves have uneven edges that come to a point at the front.  If you touch poison ivy, wash your skin with soap and water right away. Be sure to wash under your fingernails.  When hiking or camping, wear long pants, a long-sleeved shirt, tall socks, and hiking boots. You can also use a lotion on your skin that helps to prevent contact with poison ivy.  If you think that your clothes or outdoor gear came in contact with poison ivy, rinse them off with a garden hose before you bring them inside your house.  When doing yard work or gardening, wear gloves, long sleeves, long pants, and boots. Wash your garden tools and gloves if they come in contact with poison ivy.  If you think that your pet has come into contact with poison ivy, wash him or her with pet shampoo and water. Make sure to wear gloves while washing your pet. Contact a doctor if:  You have open sores in the rash area.  You have more redness, swelling, or pain in the rash area.  You have redness that spreads beyond the rash area.  You have fluid, blood, or pus coming from the rash area.  You have a fever.  You have a rash over a large area of your body.  You have a rash on your eyes, mouth, or genitals.  Your rash does not get better after a few weeks. Get help right away if:  Your face swells or your eyes  swell shut.  You have trouble breathing.  You have trouble swallowing. These symptoms may be an emergency. Do not wait to see if the symptoms will go away. Get medical help right away. Call your local emergency services (911 in the [place] Do not drive yourself to the hospital. Summary  Poison ivy dermatitis is redness and soreness of the skin caused by chemicals in the leaves of the poison ivy plant.  You may have skin redness, very bad itching, swelling, and a rash.  Do not scratch or rub your skin.  Take or apply over-the-counter and  prescription medicines only as told by your doctor. This information is not intended to replace advice given to you by your health care provider. Make sure you discuss any questions you have with your health care provider. Document Revised: 06/17/2018 Document Reviewed: 02/18/2018 Elsevier Patient Education  2020 [employer].

## 2019-10-06 NOTE — Progress Notes (Signed)
° °  Subjective:    Patient ID: Patricia Leon, female    DOB: 04-05-1975, 44 y.o.   MRN: [id number]  HPI [name] is a 44 y.o. female who presents to the [facility] acute care clinic with c/o a rash. The rash started 5 days ago.  Employee reports that last week before the rash appeared she was mowing the grass and her dog was outside in the grass and she picked him up in her arms. She describes the rash as a vesicular, weeping rash that now is beginning to dry up some.  Employee is using Calamine lotion.  She denies pain but reports itching and tightness in her (R) elbow and forearm. She denies fever or chills. She does feel that the area has some redness today.   Review of Systems  Skin: Positive for color change and rash.  All other systems reviewed and are negative.      Objective: BP (!) 136/72 (BP Location: Left Arm, Patient Position: Sitting, Cuff Size: Normal)    Pulse 74    Temp 98.4 F (36.9 C) (Temporal)    Resp 16    Ht 5\' 3"  (1.6 m)    Wt (!) 208 lb (94.3 kg)    SpO2 98%    BMI 36.85 kg/m     Physical Exam Vitals and nursing note reviewed.  HENT:     Head: Normocephalic.  Eyes:     Conjunctiva/sclera: Conjunctivae normal.  Cardiovascular:     Rate and Rhythm: Normal rate.  Pulmonary:     Effort: Pulmonary effort is normal.     Breath sounds: No wheezing.  Musculoskeletal:       Arms:     Cervical back: Normal range of motion.     Comments: The right forearm has a leiner, vesicular rash that is oozing. A similar pattern is noted on the upper arm. There is inflammation but no red streaking noted.  There are 2 small vesicular lesions noted on the left forearm.   Skin:    Findings: Rash present.     Comments: Rash to right forearm and upper arm, rash to left forearm. See M/S exam.  Neurological:     General: No focal deficit present.     Mental Status: She is alert.  Psychiatric:        Mood and Affect: Mood normal.        Assessment & Plan:  44 y.o. female here today  with rash that started 5 days ago that is consistent with contact dermitis most like poison ivy. Plan for Kenalog Cream and initially Steroid dose pack. Discussed with employee precautions of steroids during first trimester pregnancy. Employee reports she is currently trying to conceive. Discussed not using the steroid dose pack if possibility of pregnancy. Employee voices understanding and will not use. Discussed cool compresses, Caladryl lotion and Claritin if needed for itching. Discussed worsening symptoms such as red streaking, fever or other problems and need for return. Employee voices understanding and agrees with plan. If her symptoms worsen over the weekend she will f/u with Urgent Care.

## 2019-11-29 ENCOUNTER — Encounter: Payer: Self-pay | Admitting: Certified Nurse Midwife

## 2019-11-29 ENCOUNTER — Other Ambulatory Visit: Payer: Self-pay

## 2019-11-29 ENCOUNTER — Ambulatory Visit (INDEPENDENT_AMBULATORY_CARE_PROVIDER_SITE_OTHER): Payer: Managed Care, Other (non HMO) | Admitting: Certified Nurse Midwife

## 2019-11-29 VITALS — BP 120/87 | HR 77 | Ht 63.0 in | Wt 206.2 lb

## 2019-11-29 DIAGNOSIS — Z1231 Encounter for screening mammogram for malignant neoplasm of breast: Secondary | ICD-10-CM

## 2019-11-29 DIAGNOSIS — Z01419 Encounter for gynecological examination (general) (routine) without abnormal findings: Secondary | ICD-10-CM

## 2019-11-29 NOTE — Patient Instructions (Signed)

## 2019-11-29 NOTE — Progress Notes (Signed)
GYNECOLOGY ANNUAL PREVENTATIVE CARE ENCOUNTER NOTE  History:     Patricia Leon is a 44 y.o. G0P0000 female here for a routine annual gynecologic exam.  Current complaints: has menstrual headaches that she treats with tylenol and Advil.    Denies abnormal vaginal bleeding, discharge, pelvic pain, problems with intercourse or other gynecologic concerns.     Social Relationship:married  Living:with husband and do Work:[employer] office for [employer]  Exercise:Rides bike Smoke/Alcohol/drug YIR:SWNIOE   Gynecologic History Patient's last menstrual period was 11/08/2019 (approximate). Contraception: none Last Pap: 06/19/2016. Results were: normal with negative HPV Last mammogram: 09/02/18. Results were: normal   Upstream - 11/29/19 1501      Pregnancy Intention Screening   Does the patient want to become pregnant in the next year? Yes    Does the patient's partner want to become pregnant in the next year? Yes    Would the patient like to discuss contraceptive options today? No      Contraception Wrap Up   Contraception Counseling Provided No          The pregnancy intention screening data noted above was reviewed. Potential methods of contraception were discussed. The patient elected to proceed with No Method - Other Reason.    Obstetric History OB History  Gravida Para Term Preterm AB Living  0 0 0 0 0 0  SAB TAB Ectopic Multiple Live Births  0 0 0 0 0    No past medical history on file.  No past surgical history on file.  Current Outpatient Medications on File Prior to Visit  Medication Sig Dispense Refill   predniSONE (STERAPRED UNI-PAK 21 TAB) 5 MG (21) TBPK tablet Take 6 tablets day one, then 5,4,3,2,1 21 tablet 0   triamcinolone cream (KENALOG) 0.1 % Apply 1 application topically 2 (two) times daily. 30 g 0   No current facility-administered medications on file prior to visit.    Allergies  Allergen Reactions   Sulfa Antibiotics Rash    Social History:   reports that she has never smoked. She has never used smokeless tobacco. She reports current alcohol use. She reports that she does not use drugs.  Family History  Problem Relation Age of Onset   Varicose Veins Maternal Grandmother    Heart disease Maternal Grandfather    Breast cancer Maternal Aunt 62       1/2 sister by same father    The following portions of the patient's history were reviewed and updated as appropriate: allergies, current medications, past family history, past medical history, past social history, past surgical history and problem list.  Review of Systems Pertinent items noted in HPI and remainder of comprehensive ROS otherwise negative.  Physical Exam:  BP 120/87    Pulse 77    Ht 5\' 3"  (1.6 m)    Wt 206 lb 3 oz (93.5 kg)    LMP 11/08/2019 (Approximate)    BMI 36.52 kg/m  CONSTITUTIONAL: Well-developed, well-nourished, over weight female in no acute distress.  HENT:  Normocephalic, atraumatic, External right and left ear normal. Oropharynx is clear and moist EYES: Conjunctivae and EOM are normal. Pupils are equal, round, and reactive to light. No scleral icterus.  NECK: Normal range of motion, supple, no masses.  Normal thyroid.  SKIN: Skin is warm and dry. No rash noted. Not diaphoretic. No erythema. No pallor. MUSCULOSKELETAL: Normal range of motion. No tenderness.  No cyanosis, clubbing, or edema.  2+ distal pulses. NEUROLOGIC: Alert and oriented to person, place,  and time. Normal reflexes, muscle tone coordination.  PSYCHIATRIC: Normal mood and affect. Normal behavior. Normal judgment and thought content. CARDIOVASCULAR: Normal heart rate noted, regular rhythm RESPIRATORY: Clear to auscultation bilaterally. Effort and breath sounds normal, no problems with respiration noted. BREASTS: Symmetric in size. No masses, tenderness, skin changes, nipple drainage, or lymphadenopathy bilaterally.  ABDOMEN: Soft, no distention noted.  No tenderness, rebound or  guarding.  PELVIC: Normal appearing external genitalia and urethral meatus; normal appearing vaginal mucosa and cervix.  No abnormal discharge noted.  Pap smear not due  Normal uterine size, no other palpable masses, no uterine or adnexal tenderness.  .   Assessment and Plan:    1. Women's annual routine gynecological examination   Pap:not due until 2023 Mammogram :ordered Labs:none Refills:none Referral:none Routine preventative health maintenance measures emphasized. Please refer to After Visit Summary for other counseling recommendations.      Doreene Burke, CNM [facility],  [facility]

## 2019-12-14 ENCOUNTER — Telehealth: Payer: Managed Care, Other (non HMO) | Admitting: Family

## 2019-12-14 DIAGNOSIS — J019 Acute sinusitis, unspecified: Secondary | ICD-10-CM | POA: Diagnosis not present

## 2019-12-14 DIAGNOSIS — B9789 Other viral agents as the cause of diseases classified elsewhere: Secondary | ICD-10-CM | POA: Diagnosis not present

## 2019-12-14 MED ORDER — FLUTICASONE PROPIONATE 50 MCG/ACT NA SUSP: 2.0000 | Freq: Every day | NASAL | 0 refills | Status: DC

## 2019-12-14 NOTE — Progress Notes (Signed)
We are sorry that you are not feeling well.  Here is how we plan to help!  Based on what you have shared with me it looks like you have sinusitis.  Sinusitis is inflammation and infection in the sinus cavities of the head.  Based on your presentation I believe you most likely have Acute Viral Sinusitis.This is an infection most likely caused by a virus. There is not specific treatment for viral sinusitis other than to help you with the symptoms until the infection runs its course.  You may use an oral decongestant such as Mucinex D or if you have glaucoma or high blood pressure use plain Mucinex. Saline nasal spray help and can safely be used as often as needed for congestion, I have prescribed: Fluticasone nasal spray two sprays in each nostril once a day  Some authorities believe that zinc sprays or the use of Echinacea may shorten the course of your symptoms.  Sinus infections are not as easily transmitted as other respiratory infection, however we still recommend that you avoid close contact with loved ones, especially the very young and elderly.  Remember to wash your hands thoroughly throughout the day as this is the number one way to prevent the spread of infection!  Home Care:  Only take medications as instructed by your medical team.  Do not take these medications with alcohol.  A steam or ultrasonic humidifier can help congestion.  You can place a towel over your head and breathe in the steam from hot water coming from a faucet.  Avoid close contacts especially the very young and the elderly.  Cover your mouth when you cough or sneeze.  Always remember to wash your hands.  Get Help Right Away If:  You develop worsening fever or sinus pain.  You develop a severe head ache or visual changes.  Your symptoms persist after you have completed your treatment plan.  Make sure you  Understand these instructions.  Will watch your condition.  Will get help right away if you are not  doing well or get worse.  Your e-visit answers were reviewed by a board certified advanced clinical practitioner to complete your personal care plan.  Depending on the condition, your plan could have included both over the counter or prescription medications.  If there is a problem please reply  once you have received a response from your provider.  Your safety is important to us.  If you have drug allergies check your prescription carefully.    You can use MyChart to ask questions about today's visit, request a non-urgent call back, or ask for a work or school excuse for 24 hours related to this e-Visit. If it has been greater than 24 hours you will need to follow up with your provider, or enter a new e-Visit to address those concerns.  You will get an e-mail in the next two days asking about your experience.  I hope that your e-visit has been valuable and will speed your recovery. Thank you for using e-visits.  Greater than 5 minutes, yet less than 10 minutes of time have been spent researching, coordinating, and implementing care for this patient today.  Thank you for the details you included in the comment boxes. Those details are very helpful in determining the best course of treatment for you and help us to provide the best care.  

## 2019-12-18 ENCOUNTER — Ambulatory Visit: Payer: Managed Care, Other (non HMO) | Admitting: Physician Assistant

## 2019-12-18 ENCOUNTER — Encounter: Payer: Self-pay | Admitting: Physician Assistant

## 2019-12-18 ENCOUNTER — Other Ambulatory Visit: Payer: Self-pay

## 2019-12-18 VITALS — BP 142/72 | HR 73 | Temp 98.1°F | Resp 16 | Ht 63.0 in | Wt 205.0 lb

## 2019-12-18 DIAGNOSIS — J019 Acute sinusitis, unspecified: Secondary | ICD-10-CM

## 2019-12-18 NOTE — Progress Notes (Signed)
   Subjective:    Patient ID: Patricia Leon, female    DOB: 09-May-1975, 44 y.o.   MRN: [id number]  Patient is a 44 year old female who presents to the clinic because of problems with sinus is.  this problem started Wednesday October 6 with runny nose watery eyes scratchy eyes and scratchy throat. On the following Thursday October 7, the patient had a temperature elevation of 101 the temperature continued on Friday and ended on Saturday evening. Patient complains of pressure in the ears , and slight headache. There's been no sore throat, no unusual shortness of breath, no blood in the mucus. No drainage from the ears. No unusual rash. No neck stiffness. No known exposures to anyone sick. The patient has not had any previous surgeries or procedures involving the ears nose or throat. She has had a mild cough.  The patient has had negative COVID tests in the last few days , she is a non smoker. She has not had the COVID vaccine. She has been treating this situation at home with dayquil, nyquil, cough drops, and Flonase.    Sinus Problem Associated symptoms include congestion, coughing, ear pain and sinus pressure. Pertinent negatives include no sore throat.      Review of Systems  HENT: Positive for congestion, ear pain, postnasal drip, rhinorrhea, sinus pressure and sinus pain. Negative for sore throat, tinnitus and trouble swallowing.   Respiratory: Positive for cough.   Skin: Negative for rash.  All other systems reviewed and are negative.      Objective:   Physical Exam Vitals reviewed.  HENT:     Right Ear: Ear canal normal.     Left Ear: Ear canal normal.     Ears:     Comments: TM's mildly cloudy. No bulging. No redness noted.    Nose: Congestion present.     Mouth/Throat:     Mouth: Mucous membranes are moist.     Pharynx: Oropharynx is clear.  Cardiovascular:     Rate and Rhythm: Normal rate and regular rhythm.  Pulmonary:     Effort: Pulmonary effort is normal.     Breath  sounds: Normal breath sounds. No wheezing.  Skin:    General: Skin is warm and dry.     Findings: No rash.           Assessment & Plan:

## 2019-12-18 NOTE — Patient Instructions (Signed)
Pt instructed to increase fluids. Wash hands. Use Allegra D every 12 hours. Continue Nyquil at bed time. Continue Flonase, and use tylenol every 4 hours as needed. Pt encouraged to get flu shot and COVID vac. When symptoms resolve.

## 2019-12-27 ENCOUNTER — Ambulatory Visit: Payer: Managed Care, Other (non HMO) | Admitting: Physician Assistant

## 2019-12-27 ENCOUNTER — Encounter: Payer: Self-pay | Admitting: Physician Assistant

## 2019-12-27 VITALS — BP 138/84 | HR 76 | Temp 98.2°F | Resp 16 | Ht 63.0 in | Wt 200.0 lb

## 2019-12-27 DIAGNOSIS — Z0000 Encounter for general adult medical examination without abnormal findings: Secondary | ICD-10-CM | POA: Diagnosis not present

## 2019-12-27 DIAGNOSIS — Z008 Encounter for other general examination: Secondary | ICD-10-CM | POA: Diagnosis not present

## 2019-12-27 NOTE — Patient Instructions (Addendum)
Previous bout with sinusitis resolved. Discussed the need for scheduling time for self care. Discussed the need to monitor diet closely. Glucose and Lipid panel obtained. Pt to return to clinic in 1 year.

## 2019-12-27 NOTE — Progress Notes (Signed)
   Subjective:    Patient ID: Patricia Leon, female    DOB: 08/13/1975, 44 y.o.   MRN: [id number]  HPI  Pt is a 44 y/o female who presents for biometric screening today. 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 She had a bout of sinusitis, but no other new medical problems. She does not smoke. She occasionally uses ETOH. No street drugs. She spends time with her dog, her family, and goes biking for time with herself and to de-stress. No new meds. No particular concerns today.    Review of Systems  Constitutional: Negative.   HENT: Positive for congestion.   Eyes: Negative.   Respiratory: Negative.   Cardiovascular: Negative.   Gastrointestinal: Negative.   Musculoskeletal: Negative.   Neurological: Negative.   Psychiatric/Behavioral: Negative.        Objective:   Physical Exam Constitutional:      Appearance: Normal appearance.  HENT:     Right Ear: Tympanic membrane normal.     Left Ear: Tympanic membrane normal.     Mouth/Throat:     Mouth: Mucous membranes are moist.     Pharynx: Oropharynx is clear.  Eyes:     Extraocular Movements: Extraocular movements intact.     Pupils: Pupils are equal, round, and reactive to light.  Cardiovascular:     Rate and Rhythm: Normal rate.     Pulses: Normal pulses.  Pulmonary:     Effort: Pulmonary effort is normal.     Breath sounds: Normal breath sounds.  Abdominal:     Palpations: Abdomen is soft.  Musculoskeletal:        General: Normal range of motion.     Cervical back: Normal range of motion.  Neurological:     General: No focal deficit present.     Mental Status: She is alert.  Psychiatric:        Mood and Affect: Mood normal.           Assessment & Plan:

## 2019-12-28 ENCOUNTER — Encounter: Payer: Self-pay | Admitting: Physician Assistant

## 2019-12-28 LAB — LIPID PANEL
Chol/HDL Ratio: 4.9 ratio — ABNORMAL HIGH (ref 0.0–4.4)
Cholesterol, Total: 153 mg/dL (ref 100–199)
HDL: 31 mg/dL — ABNORMAL LOW (ref >39)
LDL Chol Calc (NIH): 102 mg/dL — ABNORMAL HIGH (ref 0–99)
Triglycerides: 109 mg/dL (ref 0–149)
VLDL Cholesterol Cal: 20 mg/dL (ref 5–40)

## 2019-12-28 LAB — GLUCOSE, RANDOM: Glucose: 85 mg/dL (ref 65–99)

## 2020-02-23 ENCOUNTER — Ambulatory Visit
Admission: RE | Admit: 2020-02-23 | Discharge: 2020-02-23 | Disposition: A | Discharge status: Home / Self Care | Payer: Managed Care, Other (non HMO) | Source: Ambulatory Visit | Attending: Certified Nurse Midwife | Admitting: Certified Nurse Midwife

## 2020-02-23 ENCOUNTER — Other Ambulatory Visit: Payer: Self-pay

## 2020-02-23 DIAGNOSIS — Z1231 Encounter for screening mammogram for malignant neoplasm of breast: Secondary | ICD-10-CM | POA: Insufficient documentation

## 2020-02-23 DIAGNOSIS — Z01419 Encounter for gynecological examination (general) (routine) without abnormal findings: Secondary | ICD-10-CM | POA: Diagnosis not present

## 2020-06-13 ENCOUNTER — Other Ambulatory Visit: Payer: Self-pay | Admitting: Family

## 2020-10-14 ENCOUNTER — Encounter: Payer: Self-pay | Admitting: Physician Assistant

## 2020-10-14 ENCOUNTER — Ambulatory Visit: Payer: Managed Care, Other (non HMO) | Admitting: Physician Assistant

## 2020-10-14 VITALS — BP 130/68 | HR 70 | Temp 97.9°F | Resp 16 | Ht 63.0 in | Wt 200.0 lb

## 2020-10-14 DIAGNOSIS — R519 Headache, unspecified: Secondary | ICD-10-CM

## 2020-10-14 NOTE — Progress Notes (Signed)
   Subjective:    Patient ID: Patricia Leon, female    DOB: Sep 15, 1975, 45 y.o.   MRN: [id number]  HPI 45 yo F with migraine type headache- present since yesterday 7:30 pm - Has had the same in the past  " Having bad headaches for past few years" Most recently about 3-4 weeks ago, immediately prior to her last menses, This episode started yesterday - photophobia present - early this morning  felt her focus was blurry  but that has cleared-  Had 2 Aleve this AM- mild reduction headache Ate some lunch without NV-limited appetite Muscle tension neck Hatband headache has developed over the hours of being uncomfortable Ambulates without instability  Denies dizziness  Husband drove her to visit  Review of Systems Reg menstrual cycles q 3-4 weeks Actively attempting conception- No prescriptions    Objective:   Physical Exam Vitals and nursing note reviewed.  Constitutional:      Appearance: She is well-developed. She is obese.     Comments: 5'3"  200 lbs   obvious distress from reported headache and photophobia--  Uncomfortable appearing wearing contacts all day  HENT:     Head: Normocephalic and atraumatic.     Mouth/Throat:     Mouth: Mucous membranes are moist.  Eyes:     General: Vision grossly intact. Gaze aligned appropriately.     Extraocular Movements: Extraocular movements intact.     Conjunctiva/sclera: Conjunctivae normal.     Pupils: Pupils are equal, round, and reactive to light.     Comments: Visual fields   B 20   R 25    L25  Cardiovascular:     Rate and Rhythm: Normal rate and regular rhythm.  Pulmonary:     Effort: Pulmonary effort is normal.     Breath sounds: Normal breath sounds.  Abdominal:     Palpations: Abdomen is soft.  Musculoskeletal:        General: Normal range of motion.     Cervical back: Normal range of motion and neck supple.  Skin:    General: Skin is warm and dry.  Neurological:     Mental Status: She is alert. Mental status is at  baseline.     Cranial Nerves: No cranial nerve deficit.     Motor: No weakness.     Gait: Gait normal.     Deep Tendon Reflexes: Reflexes normal.  Psychiatric:        Mood and Affect: Mood normal.        Speech: Speech normal.        Behavior: Behavior normal.      Assessment & Plan:  Migraine type headache Intervention for comfort would be indicated  Given their continued attempts at achieving pregnancy and pending menses Have discussed with patient and her husband  Discussed lack of pregnancy test capability at this clinic. They opt to shift care to Urgent Care where lab info and expanded pharmacy is available- both very appreciative of concerns and she ambulates without limitation with him to his waiting truck. Anticipate [facility] which they are familiar with.

## 2020-12-04 ENCOUNTER — Other Ambulatory Visit: Payer: Self-pay

## 2020-12-04 ENCOUNTER — Encounter: Payer: Self-pay | Admitting: Certified Nurse Midwife

## 2020-12-04 ENCOUNTER — Ambulatory Visit (INDEPENDENT_AMBULATORY_CARE_PROVIDER_SITE_OTHER): Payer: Managed Care, Other (non HMO) | Admitting: Certified Nurse Midwife

## 2020-12-04 VITALS — BP 133/84 | HR 86 | Ht 64.0 in | Wt 202.2 lb

## 2020-12-04 DIAGNOSIS — E669 Obesity, unspecified: Secondary | ICD-10-CM | POA: Diagnosis not present

## 2020-12-04 DIAGNOSIS — Z23 Encounter for immunization: Secondary | ICD-10-CM | POA: Diagnosis not present

## 2020-12-04 DIAGNOSIS — Z01419 Encounter for gynecological examination (general) (routine) without abnormal findings: Secondary | ICD-10-CM | POA: Diagnosis not present

## 2020-12-04 DIAGNOSIS — N970 Female infertility associated with anovulation: Secondary | ICD-10-CM

## 2020-12-04 DIAGNOSIS — Z1231 Encounter for screening mammogram for malignant neoplasm of breast: Secondary | ICD-10-CM

## 2020-12-04 NOTE — Progress Notes (Signed)
GYNECOLOGY ANNUAL PREVENTATIVE CARE ENCOUNTER NOTE  History:     Patricia Leon is a 45 y.o. G0P0000 female here for a routine annual gynecologic exam.  Current complaints: migraine headaches.   Denies abnormal vaginal bleeding, discharge, pelvic pain, problems with intercourse or other gynecologic concerns.     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 Contraception: none Last Pap: 06/21/2016. Results were: normal with negative HPV Last mammogram: 12/21 . Results were: normal  Obstetric History OB History  Gravida Para Term Preterm AB Living  0 0 0 0 0 0  SAB IAB Ectopic Multiple Live Births  0 0 0 0 0    History reviewed. No pertinent past medical history.  History reviewed. No pertinent surgical history.  No current outpatient medications on file prior to visit.   No current facility-administered medications on file prior to visit.    Allergies  Allergen Reactions   Sulfa Antibiotics Rash    Social History:  reports that she has never smoked. She has never used smokeless tobacco. She reports current alcohol use. She reports that she does not use drugs.  Family History  Problem Relation Age of Onset   Breast cancer Maternal Aunt 77       1/2 sister by same father   Varicose Veins Maternal Grandmother    Heart disease Maternal Grandfather     The following portions of the patient's history were reviewed and updated as appropriate: allergies, current medications, past family history, past medical history, past social history, past surgical history and problem list.  Review of Systems Pertinent items noted in HPI and remainder of comprehensive ROS otherwise negative.  Physical Exam:  BP 133/84   Pulse 86   Ht 5\' 4"  (1.626 m)   Wt 202 lb 3.2 oz (91.7 kg)    LMP 11/10/2020 (Exact Date)   BMI 34.71 kg/m  CONSTITUTIONAL: Well-developed, well-nourished female in no acute distress.  HENT:  Normocephalic, atraumatic, External right and left ear normal. Oropharynx is clear and moist EYES: Conjunctivae and EOM are normal. Pupils are equal, round, and reactive to light. No scleral icterus.  NECK: Normal range of motion, supple, no masses.  Normal thyroid.  SKIN: Skin is warm and dry. No rash noted. Not diaphoretic. No erythema. No pallor. MUSCULOSKELETAL: Normal range of motion. No tenderness.  No cyanosis, clubbing, or edema.  2+ distal pulses. NEUROLOGIC: Alert and oriented to person, place, and time. Normal reflexes, muscle tone coordination.  PSYCHIATRIC: Normal mood and affect. Normal behavior. Normal judgment and thought content. CARDIOVASCULAR: Normal heart rate noted, regular rhythm RESPIRATORY: Clear to auscultation bilaterally. Effort and breath sounds normal, no problems with respiration noted. BREASTS: Symmetric in size. No masses, tenderness, skin changes, nipple drainage, or lymphadenopathy bilaterally.  ABDOMEN: Soft, no distention noted.  No tenderness, rebound or guarding.  PELVIC: Normal appearing external genitalia and urethral meatus; normal appearing vaginal mucosa and cervix.  No abnormal discharge noted.  Pap smear obtained.  Normal uterine size, no other palpable masses, no uterine or adnexal tenderness.  .   Assessment and Plan:    1. Well woman exam with routine gynecological exam  Pap: not due Mammogram : ordered Labs: none Refills: none Colonoscopy- will wait until next year. Referral: discussed referral to neurology for migraine headaches pt would like to try home treatment  measure first . Will let me know if needed.  Routine preventative health maintenance measures emphasized. Please refer to After Visit Summary for other counseling recommendations.      Doreene Burke, CNM [facility],  [facility]

## 2021-01-29 ENCOUNTER — Other Ambulatory Visit: Payer: Self-pay | Admitting: Family

## 2021-01-29 ENCOUNTER — Telehealth: Payer: Managed Care, Other (non HMO) | Admitting: Family

## 2021-01-29 DIAGNOSIS — R6889 Other general symptoms and signs: Secondary | ICD-10-CM | POA: Diagnosis not present

## 2021-01-29 MED ORDER — OSELTAMIVIR PHOSPHATE 75 MG PO CAPS: 75.0000 mg | ORAL_CAPSULE | Freq: Two times a day (BID) | ORAL | 0 refills | Status: DC

## 2021-01-29 MED ORDER — OSELTAMIVIR PHOSPHATE 75 MG PO CAPS
75.0000 mg | ORAL_CAPSULE | Freq: Two times a day (BID) | ORAL | 0 refills | Status: DC
Start: 2021-01-29 — End: 2022-07-23

## 2021-01-29 NOTE — Patient Instructions (Signed)
Influenza, Adult °Influenza, also called "the flu," is a viral infection that mainly affects the respiratory tract. This includes the lungs, nose, and throat. The flu spreads easily from person to person (is contagious). It causes common cold symptoms, along with high fever and body aches. °What are the causes? °This condition is caused by the influenza virus. You can get the virus by: °Breathing in droplets that are in the air from an infected person's cough or sneeze. °Touching something that has the virus on it (has been contaminated) and then touching your mouth, nose, or eyes. °What increases the risk? °The following factors may make you more likely to get the flu: °Not washing or sanitizing your hands often. °Having close contact with many people during cold and flu season. °Touching your mouth, eyes, or nose without first washing or sanitizing your hands. °Not getting an annual flu shot. °You may have a higher risk for the flu, including serious problems, such as a lung infection (pneumonia), if you: °Are older than 65. °Are pregnant. °Have a weakened disease-fighting system (immune system). This includes people who have HIV or AIDS, are on chemotherapy, or are taking medicines that reduce (suppress) the immune system. °Have a long-term (chronic) illness, such as heart disease, kidney disease, diabetes, or lung disease. °Have a liver disorder. °Are severely overweight (morbidly obese). °Have anemia. °Have asthma. °What are the signs or symptoms? °Symptoms of this condition usually begin suddenly and last 4-14 days. These may include: °Fever and chills. °Headaches, body aches, or muscle aches. °Sore throat. °Cough. °Runny or stuffy (congested) nose. °Chest discomfort. °Poor appetite. °Weakness or fatigue. °Dizziness. °Nausea or vomiting. °How is this diagnosed? °This condition may be diagnosed based on: °Your symptoms and medical history. °A physical exam. °Swabbing your nose or throat and testing the fluid  for the influenza virus. °How is this treated? °If the flu is diagnosed early, you can be treated with antiviral medicine that is given by mouth (orally) or through an IV. This can help reduce how severe the illness is and how long it lasts. °Taking care of yourself at home can help relieve symptoms. Your health care provider may recommend: °Taking over-the-counter medicines. °Drinking plenty of fluids. °In many cases, the flu goes away on its own. If you have severe symptoms or complications, you may be treated in a hospital. °Follow these instructions at home: °Activity °Rest as needed and get plenty of sleep. °Stay home from work or school as told by your health care provider. Unless you are visiting your health care provider, avoid leaving home until your fever has been gone for 24 hours without taking medicine. °Eating and drinking °Take an oral rehydration solution (ORS). This is a drink that is sold at pharmacies and retail stores. °Drink enough fluid to keep your urine pale yellow. °Drink clear fluids in small amounts as you are able. Clear fluids include water, ice chips, fruit juice mixed with water, and low-calorie sports drinks. °Eat bland, easy-to-digest foods in small amounts as you are able. These foods include bananas, applesauce, rice, lean meats, toast, and crackers. °Avoid drinking fluids that contain a lot of sugar or caffeine, such as energy drinks, regular sports drinks, and soda. °Avoid alcohol. °Avoid spicy or fatty foods. °General instructions °  °Take over-the-counter and prescription medicines only as told by your health care provider. °Use a cool mist humidifier to add humidity to the air in your home. This can make it easier to breathe. °When using a cool mist humidifier,   clean it daily. Empty the water and replace it with clean water. °Cover your mouth and nose when you cough or sneeze. °Wash your hands with soap and water often and for at least 20 seconds, especially after you cough or  sneeze. If soap and water are not available, use alcohol-based hand sanitizer. °Keep all follow-up visits. This is important. °How is this prevented? ° °Get an annual flu shot. This is usually available in late summer, fall, or winter. Ask your health care provider when you should get your flu shot. °Avoid contact with people who are sick during cold and flu season. This is generally fall and winter. °Contact a health care provider if: °You develop new symptoms. °You have: °Chest pain. °Diarrhea. °A fever. °Your cough gets worse. °You produce more mucus. °You feel nauseous or you vomit. °Get help right away if you: °Develop shortness of breath or have difficulty breathing. °Have skin or nails that turn a bluish color. °Have severe pain or stiffness in your neck. °Develop a sudden headache or sudden pain in your face or ear. °Cannot eat or drink without vomiting. °These symptoms may represent a serious problem that is an emergency. Do not wait to see if the symptoms will go away. Get medical help right away. Call your local emergency services (911 in the [place] Do not drive yourself to the hospital. °Summary °Influenza, also called "the flu," is a viral infection that primarily affects your respiratory tract. °Symptoms of the flu usually begin suddenly and last 4-14 days. °Getting an annual flu shot is the best way to prevent getting the flu. °Stay home from work or school as told by your health care provider. Unless you are visiting your health care provider, avoid leaving home until your fever has been gone for 24 hours without taking medicine. °Keep all follow-up visits. This is important. °This information is not intended to replace advice given to you by your health care provider. Make sure you discuss any questions you have with your health care provider. °Document Revised: 10/13/2019 Document Reviewed: 10/13/2019 °Elsevier Patient Education © 2022 [employer] Inc. ° °

## 2021-01-29 NOTE — Progress Notes (Signed)
Virtual Visit Consent   Patricia Leon, you are scheduled for a virtual visit with a [facility] provider today.     Just as with appointments in the office, your consent must be obtained to participate.  Your consent will be active for this visit and any virtual visit you may have with one of our providers in the next 365 days.     If you have a MyChart account, a copy of this consent can be sent to you electronically.  All virtual visits are billed to your insurance company just like a traditional visit in the office.    As this is a virtual visit, video technology does not allow for your provider to perform a traditional examination.  This may limit your provider's ability to fully assess your condition.  If your provider identifies any concerns that need to be evaluated in person or the need to arrange testing (such as labs, EKG, etc.), we will make arrangements to do so.     Although advances in technology are sophisticated, we cannot ensure that it will always work on either your end or our end.  If the connection with a video visit is poor, the visit may have to be switched to a telephone visit.  With either a video or telephone visit, we are not always able to ensure that we have a secure connection.     I need to obtain your verbal consent now.   Are you willing to proceed with your visit today?    Patricia Leon has provided verbal consent on 01/29/2021 for a virtual visit (video or telephone).   Jannifer Rodney, FNP   Date: 01/29/2021 3:36 PM   Virtual Visit via Video Note   I, Jannifer Rodney, connected with  Patricia Leon  ([id number], [birth year]) on 01/29/21 at  3:30 PM EST by a video-enabled telemedicine application and verified that I am speaking with the correct person using two identifiers.  Location: Patient: Virtual Visit Location Patient: Home Provider: Virtual Visit Location Provider: Home Office   I discussed the limitations of evaluation and management by telemedicine and the  availability of in person appointments. The patient expressed understanding and agreed to proceed.    History of Present Illness: Patricia Leon is a 45 y.o. who identifies as a female who was assigned female at birth, and is being seen today for sore throat and fever.  HPI: Sore Throat  This is a new problem. The current episode started yesterday. The problem has been gradually worsening. The maximum temperature recorded prior to her arrival was 102 - 102.9 F. The pain is at a severity of 7/10. The pain is moderate. Associated symptoms include congestion, ear pain, headaches, a hoarse voice, swollen glands and trouble swallowing. Pertinent negatives include no coughing or shortness of breath. She has tried acetaminophen for the symptoms. The treatment provided mild relief.   Problems:  Patient Active Problem List   Diagnosis Date Noted   Obesity (BMI 30-39.9) 12/04/2020   Infertility associated with anovulation 12/04/2020    Allergies:  Allergies  Allergen Reactions   Sulfa Antibiotics Rash   Medications:  Current Outpatient Medications:    oseltamivir (TAMIFLU) 75 MG capsule, Take 1 capsule (75 mg total) by mouth 2 (two) times daily., Disp: 10 capsule, Rfl: 0  Observations/Objective: Patient is well-developed, well-nourished in no acute distress.  Resting comfortably  at home.  Head is normocephalic, atraumatic.  No labored breathing.  Speech is clear and coherent with logical content.  Patient  is alert and oriented at baseline.  Nasal congestion  Assessment and Plan: 1. Flu-like symptoms - oseltamivir (TAMIFLU) 75 MG capsule; Take 1 capsule (75 mg total) by mouth 2 (two) times daily.  Dispense: 10 capsule; Refill: 0 - Take meds as prescribed - Use a cool mist humidifier  -Use saline nose sprays frequently -Force fluids -For any cough or congestion  Use plain Mucinex- regular strength or max strength is fine -For fever or aces or pains- take tylenol or ibuprofen. -Throat  lozenges if help Follow up if symptoms worsen or do not improve   Follow Up Instructions: I discussed the assessment and treatment plan with the patient. The patient was provided an opportunity to ask questions and all were answered. The patient agreed with the plan and demonstrated an understanding of the instructions.  A copy of instructions were sent to the patient via MyChart unless otherwise noted below.     The patient was advised to call back or seek an in-person evaluation if the symptoms worsen or if the condition fails to improve as anticipated.  Time:  I spent 7 minutes with the patient via telehealth technology discussing the above problems/concerns.    Jannifer Rodney, FNP

## 2021-04-11 ENCOUNTER — Other Ambulatory Visit: Payer: Self-pay

## 2021-04-11 ENCOUNTER — Ambulatory Visit
Admission: RE | Admit: 2021-04-11 | Discharge: 2021-04-11 | Disposition: A | Discharge status: Home / Self Care | Payer: Managed Care, Other (non HMO) | Source: Ambulatory Visit | Attending: Certified Nurse Midwife | Admitting: Certified Nurse Midwife

## 2021-04-11 DIAGNOSIS — Z01419 Encounter for gynecological examination (general) (routine) without abnormal findings: Secondary | ICD-10-CM | POA: Insufficient documentation

## 2021-04-11 DIAGNOSIS — Z1231 Encounter for screening mammogram for malignant neoplasm of breast: Secondary | ICD-10-CM | POA: Insufficient documentation

## 2021-12-08 ENCOUNTER — Encounter: Payer: Managed Care, Other (non HMO) | Admitting: Certified Nurse Midwife

## 2022-02-25 ENCOUNTER — Telehealth: Payer: Self-pay | Admitting: Certified Nurse Midwife

## 2022-02-25 NOTE — Telephone Encounter (Signed)
Left message for patient to call office back to r/s appt for 03/16/2022

## 2022-03-05 ENCOUNTER — Ambulatory Visit: Payer: Self-pay | Admitting: Certified Nurse Midwife

## 2022-03-16 ENCOUNTER — Ambulatory Visit: Payer: Self-pay | Admitting: Certified Nurse Midwife

## 2022-07-16 ENCOUNTER — Other Ambulatory Visit: Payer: Self-pay | Admitting: Physician Assistant

## 2022-07-16 ENCOUNTER — Ambulatory Visit
Admission: RE | Admit: 2022-07-16 | Discharge: 2022-07-16 | Disposition: A | Discharge status: Home / Self Care | Payer: Managed Care, Other (non HMO) | Source: Ambulatory Visit | Attending: Physician Assistant | Admitting: Physician Assistant

## 2022-07-16 ENCOUNTER — Ambulatory Visit
Admission: RE | Admit: 2022-07-16 | Discharge: 2022-07-16 | Disposition: A | Discharge status: Home / Self Care | Payer: Managed Care, Other (non HMO) | Attending: Physician Assistant | Admitting: Physician Assistant

## 2022-07-16 DIAGNOSIS — M25561 Pain in right knee: Secondary | ICD-10-CM

## 2022-07-23 ENCOUNTER — Ambulatory Visit: Payer: Managed Care, Other (non HMO) | Admitting: Certified Nurse Midwife

## 2022-07-23 ENCOUNTER — Other Ambulatory Visit (HOSPITAL_COMMUNITY)
Admission: RE | Admit: 2022-07-23 | Discharge: 2022-07-23 | Disposition: A | Discharge status: Home / Self Care | Payer: Managed Care, Other (non HMO) | Source: Ambulatory Visit | Attending: Certified Nurse Midwife | Admitting: Certified Nurse Midwife

## 2022-07-23 ENCOUNTER — Encounter: Payer: Self-pay | Admitting: Certified Nurse Midwife

## 2022-07-23 VITALS — BP 131/83 | HR 71 | Resp 16 | Ht 64.0 in | Wt 209.7 lb

## 2022-07-23 DIAGNOSIS — Z1211 Encounter for screening for malignant neoplasm of colon: Secondary | ICD-10-CM

## 2022-07-23 DIAGNOSIS — Z01419 Encounter for gynecological examination (general) (routine) without abnormal findings: Secondary | ICD-10-CM | POA: Diagnosis not present

## 2022-07-23 DIAGNOSIS — Z124 Encounter for screening for malignant neoplasm of cervix: Secondary | ICD-10-CM | POA: Insufficient documentation

## 2022-07-23 DIAGNOSIS — Z1231 Encounter for screening mammogram for malignant neoplasm of breast: Secondary | ICD-10-CM

## 2022-07-23 MED ORDER — SUMATRIPTAN SUCCINATE 50 MG PO TABS: 50.0000 mg | ORAL_TABLET | ORAL | 2 refills | Status: DC | PRN

## 2022-07-23 NOTE — Progress Notes (Addendum)
GYNECOLOGY ANNUAL PREVENTATIVE CARE ENCOUNTER NOTE  History:     Patricia Leon is a 47 y.o. G0P0000 female here for a routine annual gynecologic exam.  Current complaints: fatigue, worsening menstrual migraines, weight gain, and mood changes.   Denies abnormal vaginal bleeding, discharge, pelvic pain, problems with intercourse or other gynecologic concerns.     Social Relationship: married  Living: spouse and dog Work: Adult nurse  Exercise: few x month  Smoke/Alcohol/drug use: occasional wine, denies smoking & drug use   Gynecologic History Patient's last menstrual period was 06/29/2022. Contraception: none Last Pap: 06/21/2016. Results were: normal with negative HPV Last mammogram: 04/11/2021. Results were: normal  Obstetric History OB History  Gravida Para Term Preterm AB Living  0 0 0 0 0 0  SAB IAB Ectopic Multiple Live Births  0 0 0 0 0    History reviewed. No pertinent past medical history.  History reviewed. No pertinent surgical history.  Current Outpatient Medications on File Prior to Visit  Medication Sig Dispense Refill   meloxicam (MOBIC) 15 MG tablet Take 15 mg by mouth daily.     No current facility-administered medications on file prior to visit.    Allergies  Allergen Reactions   Sulfa Antibiotics Rash    Social History:  reports that she has never smoked. She has never used smokeless tobacco. She reports current alcohol use. She reports that she does not use drugs.  Family History  Problem Relation Age of Onset   Breast cancer Maternal Aunt 19       1/2 sister by same father   Varicose Veins Maternal Grandmother    Heart disease Maternal Grandfather     The following portions of the patient's history were reviewed and updated as appropriate: allergies, current medications, past family history, past medical history, past social history, past surgical history and problem list.  Review of Systems Pertinent items noted in HPI and  remainder of comprehensive ROS otherwise negative.  Physical Exam:  BP 131/83   Pulse 71   Resp 16   Ht 5\' 4"  (1.626 m)   Wt 209 lb 11.2 oz (95.1 kg)   LMP 06/29/2022   BMI 35.99 kg/m  CONSTITUTIONAL: Well-developed, well-nourished female in no acute distress.  HENT:  Normocephalic, atraumatic, External right and left ear normal. Oropharynx is clear and moist EYES: Conjunctivae and EOM are normal. Pupils are equal, round, and reactive to light. No scleral icterus.  NECK: Normal range of motion, supple, no masses.  Normal thyroid.  SKIN: Skin is warm and dry. No rash noted. Not diaphoretic. No erythema. No pallor. MUSCULOSKELETAL: Normal range of motion. No tenderness.  No cyanosis, clubbing, or edema.  2+ distal pulses. NEUROLOGIC: Alert and oriented to person, place, and time. Normal reflexes, muscle tone coordination.  PSYCHIATRIC: Normal mood and affect. Normal behavior. Normal judgment and thought content. CARDIOVASCULAR: Normal heart rate noted, regular rhythm RESPIRATORY: Clear to auscultation bilaterally. Effort and breath sounds normal, no problems with respiration noted. BREASTS: Symmetric in size. No masses, tenderness, skin changes, nipple drainage, or lymphadenopathy bilaterally.  ABDOMEN: Soft, no distention noted.  No tenderness, rebound or guarding.  PELVIC: Normal appearing external genitalia and urethral meatus; normal appearing vaginal mucosa and cervix.  No abnormal discharge noted.  Pap smear obtained.  Normal uterine size, no other palpable masses, no uterine or adnexal tenderness.  .   Assessment and Plan:    1. Women's annual routine gynecological examination   Pap: Will  follow up results of pap smear and manage accordingly. Mammogram : ordered  Labs: will have done at her work  Refills/orders: Imitrex , colo guard (pt declines colonoscopy at this time).  Referral: none  Routine preventative health maintenance measures emphasized. Please refer to After Visit  Summary for other counseling recommendations.     Discussed perimenopausal symptoms , reassurance of normal findings. Pt declines treatment for mood ,says it is not that bad and she can manage. Discussed use of Imitrex for menstrual migraine. She is in agreement. Discussed regular exercise to help with weight gain and mood. She verbalizes and agrees  Doreene Burke, CNM [facility]  [facility],  [facility]

## 2022-07-23 NOTE — Patient Instructions (Signed)
Preventive Care 40-47 Years Old, Female Preventive care refers to lifestyle choices and visits with your health care provider that can promote health and wellness. Preventive care visits are also called wellness exams. What can I expect for my preventive care visit? Counseling Your health care provider may ask you questions about your: Medical history, including: Past medical problems. Family medical history. Pregnancy history. Current health, including: Menstrual cycle. Method of birth control. Emotional well-being. Home life and relationship well-being. Sexual activity and sexual health. Lifestyle, including: Alcohol, nicotine or tobacco, and drug use. Access to firearms. Diet, exercise, and sleep habits. Work and work environment. Sunscreen use. Safety issues such as seatbelt and bike helmet use. Physical exam Your health care provider will check your: Height and weight. These may be used to calculate your BMI (body mass index). BMI is a measurement that tells if you are at a healthy weight. Waist circumference. This measures the distance around your waistline. This measurement also tells if you are at a healthy weight and may help predict your risk of certain diseases, such as type 2 diabetes and high blood pressure. Heart rate and blood pressure. Body temperature. Skin for abnormal spots. What immunizations do I need?  Vaccines are usually given at various ages, according to a schedule. Your health care provider will recommend vaccines for you based on your age, medical history, and lifestyle or other factors, such as travel or where you work. What tests do I need? Screening Your health care provider may recommend screening tests for certain conditions. This may include: Lipid and cholesterol levels. Diabetes screening. This is done by checking your blood sugar (glucose) after you have not eaten for a while (fasting). Pelvic exam and Pap test. Hepatitis B test. Hepatitis C  test. HIV (human immunodeficiency virus) test. STI (sexually transmitted infection) testing, if you are at risk. Lung cancer screening. Colorectal cancer screening. Mammogram. Talk with your health care provider about when you should start having regular mammograms. This may depend on whether you have a family history of breast cancer. BRCA-related cancer screening. This may be done if you have a family history of breast, ovarian, tubal, or peritoneal cancers. Bone density scan. This is done to screen for osteoporosis. Talk with your health care provider about your test results, treatment options, and if necessary, the need for more tests. Follow these instructions at home: Eating and drinking  Eat a diet that includes fresh fruits and vegetables, whole grains, lean protein, and low-fat dairy products. Take vitamin and mineral supplements as recommended by your health care provider. Do not drink alcohol if: Your health care provider tells you not to drink. You are pregnant, may be pregnant, or are planning to become pregnant. If you drink alcohol: Limit how much you have to 0-1 drink a day. Know how much alcohol is in your drink. In the [place] one drink equals one 12 oz bottle of beer (355 mL), one 5 oz glass of wine (148 mL), or one 1 oz glass of hard liquor (44 mL). Lifestyle Brush your teeth every morning and night with fluoride toothpaste. Floss one time each day. Exercise for at least 30 minutes 5 or more days each week. Do not use any products that contain nicotine or tobacco. These products include cigarettes, chewing tobacco, and vaping devices, such as e-cigarettes. If you need help quitting, ask your health care provider. Do not use drugs. If you are sexually active, practice safe sex. Use a condom or other form of protection to   prevent STIs. If you do not wish to become pregnant, use a form of birth control. If you plan to become pregnant, see your health care provider for a  prepregnancy visit. Take aspirin only as told by your health care provider. Make sure that you understand how much to take and what form to take. Work with your health care provider to find out whether it is safe and beneficial for you to take aspirin daily. Find healthy ways to manage stress, such as: Meditation, yoga, or listening to music. Journaling. Talking to a trusted person. Spending time with friends and family. Minimize exposure to UV radiation to reduce your risk of skin cancer. Safety Always wear your seat belt while driving or riding in a vehicle. Do not drive: If you have been drinking alcohol. Do not ride with someone who has been drinking. When you are tired or distracted. While texting. If you have been using any mind-altering substances or drugs. Wear a helmet and other protective equipment during sports activities. If you have firearms in your house, make sure you follow all gun safety procedures. Seek help if you have been physically or sexually abused. What's next? Visit your health care provider once a year for an annual wellness visit. Ask your health care provider how often you should have your eyes and teeth checked. Stay up to date on all vaccines. This information is not intended to replace advice given to you by your health care provider. Make sure you discuss any questions you have with your health care provider. Document Revised: 08/21/2020 Document Reviewed: 08/21/2020 Elsevier Patient Education  2023 [employer] Inc.  

## 2022-07-24 NOTE — Addendum Note (Signed)
Addended by: Mechele Claude on: 07/24/2022 10:03 AM   Modules accepted: Orders

## 2022-07-27 ENCOUNTER — Other Ambulatory Visit: Payer: Self-pay

## 2022-07-27 ENCOUNTER — Encounter: Payer: Self-pay | Admitting: Certified Nurse Midwife

## 2022-07-27 MED ORDER — SUMATRIPTAN SUCCINATE 50 MG PO TABS: 50.0000 mg | ORAL_TABLET | ORAL | 2 refills | Status: DC | PRN

## 2022-07-28 LAB — CYTOLOGY - PAP
Comment: NEGATIVE
Diagnosis: NEGATIVE
High risk HPV: NEGATIVE

## 2023-01-19 ENCOUNTER — Other Ambulatory Visit (HOSPITAL_COMMUNITY)
Admission: RE | Admit: 2023-01-19 | Discharge: 2023-01-19 | Disposition: A | Discharge status: Home / Self Care | Payer: Managed Care, Other (non HMO) | Source: Ambulatory Visit | Attending: Certified Nurse Midwife | Admitting: Certified Nurse Midwife

## 2023-01-19 ENCOUNTER — Ambulatory Visit (INDEPENDENT_AMBULATORY_CARE_PROVIDER_SITE_OTHER): Payer: Managed Care, Other (non HMO) | Admitting: Certified Nurse Midwife

## 2023-01-19 ENCOUNTER — Encounter: Payer: Self-pay | Admitting: Certified Nurse Midwife

## 2023-01-19 VITALS — BP 139/82 | HR 64 | Ht 64.0 in | Wt 204.3 lb

## 2023-01-19 DIAGNOSIS — Z113 Encounter for screening for infections with a predominantly sexual mode of transmission: Secondary | ICD-10-CM | POA: Insufficient documentation

## 2023-01-19 DIAGNOSIS — N939 Abnormal uterine and vaginal bleeding, unspecified: Secondary | ICD-10-CM

## 2023-01-19 MED ORDER — TRANEXAMIC ACID 650 MG PO TABS: 1300.0000 mg | ORAL_TABLET | Freq: Three times a day (TID) | ORAL | 1 refills | Status: AC

## 2023-01-19 MED ORDER — MEDROXYPROGESTERONE ACETATE 10 MG PO TABS: 10.0000 mg | ORAL_TABLET | Freq: Three times a day (TID) | ORAL | 1 refills | Status: DC

## 2023-01-19 NOTE — Addendum Note (Signed)
Addended by: Mechele Claude on: 01/19/2023 05:04 PM   Modules accepted: Orders

## 2023-01-19 NOTE — Progress Notes (Addendum)
GYN ENCOUNTER NOTE  Subjective:       Patricia Leon is a 47 y.o. G0P0000 female is here for gynecologic evaluation of the following issues:  1. Pt states she has been bleeding since the end of September. She states it has ebbed and flowed . It has been heavy for several days passing clots saturating tampons, then gets lighter, She has experienced cramping, pressure, and back pain that radiates down her legs.     Gynecologic History No LMP recorded (within months). Contraception: none Last Pap: 07/23/2022. Results were: normal Last mammogram: 04/11/2021. Results were: normal  Obstetric History OB History  Gravida Para Term Preterm AB Living  0 0 0 0 0 0  SAB IAB Ectopic Multiple Live Births  0 0 0 0 0    No past medical history on file.  No past surgical history on file.  Current Outpatient Medications on File Prior to Visit  Medication Sig Dispense Refill   meloxicam (MOBIC) 15 MG tablet Take 15 mg by mouth daily.     SUMAtriptan (IMITREX) 50 MG tablet Take 1 tablet (50 mg total) by mouth every 2 (two) hours as needed for migraine. May repeat in 2 hours if headache persists or recurs. Max dose 200 mg in 24 hours 10 tablet 2   No current facility-administered medications on file prior to visit.    Allergies  Allergen Reactions   Sulfa Antibiotics Rash    Social History   Socioeconomic History   Marital status: Married    Spouse name: Not on file   Number of children: Not on file   Years of education: Not on file   Highest education level: Not on file  Occupational History   Not on file  Tobacco Use   Smoking status: Never   Smokeless tobacco: Never  Vaping Use   Vaping status: Never Used  Substance and Sexual Activity   Alcohol use: Yes    Comment: Socially    Drug use: No   Sexual activity: Yes    Birth control/protection: None  Other Topics Concern   Not on file  Social History Narrative   Not on file   Social Determinants of Health   Financial Resource  Strain: Not on file  Food Insecurity: Not on file  Transportation Needs: Not on file  Physical Activity: Not on file  Stress: Not on file  Social Connections: Not on file  Intimate Partner Violence: Not on file    Family History  Problem Relation Age of Onset   Breast cancer Maternal Aunt 31       1/2 sister by same father   Varicose Veins Maternal Grandmother    Heart disease Maternal Grandfather     The following portions of the patient's history were reviewed and updated as appropriate: allergies, current medications, past family history, past medical history, past social history, past surgical history and problem list.  Review of Systems Review of Systems - Negative except as mentioned in HPI Review of Systems - General ROS: negative for - chills, fatigue, fever, hot flashes, malaise or night sweats Hematological and Lymphatic ROS: negative for - bleeding problems or swollen lymph nodes Gastrointestinal ROS: negative for - abdominal pain, blood in stools, change in bowel habits and nausea/vomiting Musculoskeletal ROS: negative for - joint pain, muscle pain or muscular weakness Genito-Urinary ROS: negative for - change in menstrual cycle, dysmenorrhea, dyspareunia, dysuria, genital discharge, genital ulcers, hematuria, incontinence, irregular, nocturia. Positive for pelvic pressure, and heavy , prolonged period  Objective:   BP 139/82   Pulse 64   Ht 5\' 4"  (1.626 m)   Wt 204 lb 4.8 oz (92.7 kg)   LMP  (Within Months) Comment: patient reports Sept 2024  BMI 35.07 kg/m  CONSTITUTIONAL: Well-developed, well-nourished female in no acute distress.  HENT:  Normocephalic, atraumatic.  NECK: Normal range of motion, supple, no masses.  Normal thyroid.  SKIN: Skin is warm and dry. No rash noted. Not diaphoretic. No erythema. No pallor. NEUROLGIC: Alert and oriented to person, place, and time. PSYCHIATRIC: Normal mood and affect. Normal behavior. Normal judgment and thought  content. CARDIOVASCULAR:Not Examined RESPIRATORY: Not Examined BREASTS: Not Examined ABDOMEN: Soft, non distended; Non tender.  No Organomegaly. PELVIC:  External Genitalia: Normal  BUS: Normal  Vagina: Normal, swab collected   Cervix: Normal small amount of dark blood noted coming out of cervix. No polyps  Uterus: Normal size, shape,consistency, mobile  Adnexa: Normal  RV: Normal   Bladder: Nontender MUSCULOSKELETAL: Normal range of motion. No tenderness.  No cyanosis, clubbing, or edema.     Assessment:   Abnormal uterine bleeding   Plan:   U/s ordered to evaluate possible causes of bleeding. Discussed perimenopausal changes. Swab collected to r/o infection. Discussed use of BC, progestin, and TXA . She would like to try a course of progestin. Will follow up with u/s results and lab testing.   Follow up prn or for annual exam.   Pattricia Boss Makella Buckingham,CNM

## 2023-01-19 NOTE — Addendum Note (Signed)
Addended by: Fonda Kinder on: 01/19/2023 03:53 PM   Modules accepted: Orders

## 2023-01-19 NOTE — Patient Instructions (Signed)
 Abnormal Uterine Bleeding    Abnormal uterine bleeding means bleeding more than normal from your womb (uterus). It can include:  Bleeding after sex.  Bleeding between monthly (menstrual) periods.  Bleeding that is heavier than normal.  Monthly periods that last longer than normal.  Bleeding after you have stopped having your monthly period (menopause).  You should see a doctor for any kind of bleeding that is not normal. Treatment depends on the cause of your bleeding and how much you bleed.  Follow these instructions at home:  Medicines  Take over-the-counter and prescription medicines only as told by your doctor.  Ask your doctor about:  Taking medicines such as aspirin and ibuprofen. Do not take these medicines unless your doctor tells you to take them.  Taking over-the-counter medicines, vitamins, herbs, and supplements.  You may be given iron pills. Take them as told by your doctor.  Managing constipation  If you take iron pills, you may need to take these actions to prevent or treat trouble pooping (constipation):  Drink enough fluid to keep your pee (urine) pale yellow.  Take over-the-counter or prescription medicines.  Eat foods that are high in fiber. These include beans, whole grains, and fresh fruits and vegetables.  Limit foods that are high in fat and sugar. These include fried or sweet foods.  Activity  Change your activity to decrease bleeding if you need to change your sanitary pad more than one time every 2 hours:  Lie in bed with your feet raised (elevated).  Place a cold pack on your lower belly.  Rest as much as you are able until the bleeding stops or slows down.  General instructions  Do not use tampons, douche, or have sex until your doctor says these things are okay.  Change your pads often.  Get regular exams. These include:  Pelvic exams.  Screenings for cancer of the cervix.  It is up to you to get the results of any tests that are done. Ask how to get your results when they are  ready.  Watch for any changes in your bleeding. For 2 months, write down:  When your monthly period starts.  When your monthly period ends.  When you get any abnormal bleeding from your vagina.  What problems you notice.  Keep all follow-up visits.  Contact a doctor if:  The bleeding lasts more than one week.  You feel dizzy at times.  You feel like you may vomit (nausea).  You vomit.  You feel light-headed or weak.  Your symptoms get worse.  Get help right away if:  You faint.  You have to change pads every hour.  You have pain in your belly.  You have a fever or chills.  You get sweaty or weak.  You pass large blood clots from your vagina.  These symptoms may be an emergency. Get help right away. Call your local emergency services (911 in the [place]  Do not wait to see if the symptoms will go away.  Do not drive yourself to the hospital.  Summary  Abnormal uterine bleeding means bleeding more than normal from your womb (uterus).  Any kind of bleeding that is not normal should be checked by a doctor.  Treatment depends on the cause of your bleeding and how much you bleed.  Get help right away if you faint, you have to change pads every hour, or you pass large blood clots from your vagina.  This information is not intended to replace  advice given to you by your health care provider. Make sure you discuss any questions you have with your health care provider.  Document Revised: 06/25/2020 Document Reviewed: 06/25/2020  Elsevier Patient Education  2024 [employer].

## 2023-01-20 ENCOUNTER — Other Ambulatory Visit: Payer: Managed Care, Other (non HMO)

## 2023-01-20 DIAGNOSIS — N939 Abnormal uterine and vaginal bleeding, unspecified: Secondary | ICD-10-CM | POA: Diagnosis not present

## 2023-01-21 LAB — CERVICOVAGINAL ANCILLARY ONLY
Bacterial Vaginitis (gardnerella): NEGATIVE
Candida Glabrata: NEGATIVE
Candida Vaginitis: NEGATIVE
Comment: NEGATIVE
Comment: NEGATIVE
Comment: NEGATIVE

## 2023-01-28 ENCOUNTER — Other Ambulatory Visit: Payer: Self-pay | Admitting: Certified Nurse Midwife

## 2023-01-28 ENCOUNTER — Encounter: Payer: Self-pay | Admitting: Certified Nurse Midwife

## 2023-01-28 DIAGNOSIS — N83291 Other ovarian cyst, right side: Secondary | ICD-10-CM

## 2023-02-11 ENCOUNTER — Encounter: Payer: Self-pay | Admitting: Certified Nurse Midwife

## 2023-02-11 ENCOUNTER — Ambulatory Visit: Payer: Managed Care, Other (non HMO) | Admitting: Certified Nurse Midwife

## 2023-02-11 VITALS — BP 126/84 | HR 67 | Wt 202.5 lb

## 2023-02-11 DIAGNOSIS — N939 Abnormal uterine and vaginal bleeding, unspecified: Secondary | ICD-10-CM

## 2023-02-11 MED ORDER — MEDROXYPROGESTERONE ACETATE 10 MG PO TABS: 10.0000 mg | ORAL_TABLET | Freq: Three times a day (TID) | ORAL | 3 refills | Status: DC

## 2023-02-11 NOTE — Progress Notes (Signed)
    GYNECOLOGY PROGRESS NOTE  Subjective:    Patient ID: Patricia Leon, female    DOB: 1975/11/09, 47 y.o.   MRN: [id number]  HPI  Patient is a 47 y.o. G0P0000 female who presents for 3 week follow up for abnormal uterine bleeding. Patient was last seen in office 01/19/23 and started on Progestin, ultrasound showed right ovarian cyst measuring 2.4 x 2.0 x 2.7cm. Patient reports good compliance on medication and states that she completed course, patient states that on medication bleeding had stopped. Patient reports that when she finished medication she had bleeding that lasted a week and describes flow as heavy. Patient reports that on 02/02/23 she began experiencing severe cramping that last several hours, patient states that she tried using a heat pad and was unable to get in a comfortable position, patient states that when she used restroom to void she passed a large clot and states that when it passed she experienced immediate relief.   The following portions of the patient's history were reviewed and updated as appropriate: allergies, current medications, past family history, past medical history, past social history, past surgical history, and problem list.  Review of Systems Pertinent items are noted in HPI.   Objective:   Blood pressure 126/84, pulse 67, weight 202 lb 8 oz (91.9 kg). Body mass index is 34.76 kg/m. General appearance: alert, cooperative, and appears stated age Abdomen: soft, non-tender; bowel sounds normal; no masses,  no organomegaly Pelvic:  deferred Extremities: extremities normal, atraumatic, no cyanosis or edema Neurologic: Alert and oriented X 3, normal strength and tone. Normal symmetric reflexes. Normal coordination and gait   Assessment:   Abnormal uterine bleeding.  Plan:  Pt state the provera worked well for her but noted with her next period she had significant bleeding and cramping that stopped after passing a large clots. This concerned her so she  wanted to follow up. She denies any bleeding since passing the clot. Discussed watchful waiting to see if her cycles now resume to normal vs needed to continue provera , or use of progestin only pill for daily maintenance. She verbalizes and agrees to plan. Refill placed for provera.   Doreene Burke, CNM

## 2023-06-28 ENCOUNTER — Encounter: Payer: Self-pay | Admitting: Certified Nurse Midwife

## 2023-06-28 ENCOUNTER — Ambulatory Visit (INDEPENDENT_AMBULATORY_CARE_PROVIDER_SITE_OTHER): Admitting: Certified Nurse Midwife

## 2023-06-28 VITALS — BP 129/69 | HR 65 | Ht 64.0 in | Wt 208.9 lb

## 2023-06-28 DIAGNOSIS — N920 Excessive and frequent menstruation with regular cycle: Secondary | ICD-10-CM | POA: Diagnosis not present

## 2023-06-28 MED ORDER — KETOROLAC TROMETHAMINE 10 MG PO TABS: 10.0000 mg | ORAL_TABLET | Freq: Four times a day (QID) | ORAL | 0 refills | Status: DC | PRN

## 2023-06-28 NOTE — Progress Notes (Signed)
 GYN ENCOUNTER NOTE  Subjective:       Patricia Leon is a 48 y.o. G0P0000 female is here for gynecologic evaluation of the following issues:  1. Heavy painful periods, pt states that the provera  worked to stop the bleeding. She had several normal cycles. Last month she had sever pain that was debilitating back pain. She had difficultly getting out of bed. She was using tylenol and mydol as well as heating pad. She is not wanting to be on birth control as she is hoping to get pregnant. She has declined fertility treatment.      Gynecologic History Patient's last menstrual period was 06/10/2023 (exact date). Contraception: none Last Pap: 07/23/2022. Results were: normal Last mammogram: 04/11/21. Results were: normal  Obstetric History OB History  Gravida Para Term Preterm AB Living  0 0 0 0 0 0  SAB IAB Ectopic Multiple Live Births  0 0 0 0 0    No past medical history on file.  No past surgical history on file.  Current Outpatient Medications on File Prior to Visit  Medication Sig Dispense Refill   aspirin-acetaminophen-caffeine (EXCEDRIN MIGRAINE) 250-250-65 MG tablet 2 tablets Orally Once a day     meloxicam (MOBIC) 15 MG tablet Take 15 mg by mouth daily.     SUMAtriptan  (IMITREX ) 50 MG tablet Take 1 tablet (50 mg total) by mouth every 2 (two) hours as needed for migraine. May repeat in 2 hours if headache persists or recurs. Max dose 200 mg in 24 hours 10 tablet 2   No current facility-administered medications on file prior to visit.    Allergies  Allergen Reactions   Sulfa Antibiotics Rash    Social History   Socioeconomic History   Marital status: Married    Spouse name: Not on file   Number of children: Not on file   Years of education: Not on file   Highest education level: Not on file  Occupational History   Not on file  Tobacco Use   Smoking status: Never   Smokeless tobacco: Never  Vaping Use   Vaping status: Never Used  Substance and Sexual Activity    Alcohol use: Yes    Comment: Socially    Drug use: No   Sexual activity: Yes    Birth control/protection: None  Other Topics Concern   Not on file  Social History Narrative   Not on file   Social Drivers of Health   Financial Resource Strain: Not on file  Food Insecurity: Not on file  Transportation Needs: Not on file  Physical Activity: Not on file  Stress: Not on file  Social Connections: Not on file  Intimate Partner Violence: Not on file    Family History  Problem Relation Age of Onset   Breast cancer Maternal Aunt 25       1/2 sister by same father   Varicose Veins Maternal Grandmother    Heart disease Maternal Grandfather     The following portions of the patient's history were reviewed and updated as appropriate: allergies, current medications, past family history, past medical history, past social history, past surgical history and problem list.  Review of Systems Review of Systems - Negative except as mentioned in HPI Review of Systems - General ROS: negative for - chills, fatigue, fever, hot flashes, malaise or night sweats Hematological and Lymphatic ROS: negative for - bleeding problems or swollen lymph nodes Gastrointestinal ROS: negative for - abdominal pain, blood in stools, change in bowel habits and  nausea/vomiting Musculoskeletal ROS: negative for - joint pain, muscle pain or muscular weakness Genito-Urinary ROS: negative for - change in menstrual cycle, , dyspareunia, dysuria, genital discharge, genital ulcers, hematuria, incontinence, irregular menses, nocturia or pelvic pain. Positive for heavy painful periods.  Objective:   BP 129/69   Pulse 65   Ht 5\' 4"  (1.626 m)   Wt 208 lb 14.4 oz (94.8 kg)   LMP 06/10/2023 (Exact Date)   BMI 35.86 kg/m  CONSTITUTIONAL: Well-developed, well-nourished female in no acute distress.  HENT:  Normocephalic, atraumatic.  NECK: Normal range of motion, supple, no masses.  Normal thyroid.  SKIN: Skin is warm and dry.  No rash noted. Not diaphoretic. No erythema. No pallor. NEUROLGIC: Alert and oriented to person, place, and time. PSYCHIATRIC: Normal mood and affect. Normal behavior. Normal judgment and thought content. CARDIOVASCULAR:Not Examined RESPIRATORY: Not Examined BREASTS: Not Examined ABDOMEN: Soft, non distended; Non tender.  No Organomegaly. PELVIC:not indicated  MUSCULOSKELETAL: Normal range of motion. No tenderness.  No cyanosis, clubbing, or edema.     Assessment:   1. Menorrhagia with regular cycle (Primary)     Plan:   She declines birth control and hormonal options to control cycle as she is hoping to get pregnant . She is declining fertility treatments. Discussed use of Toradol  for sever pain. Pt instructed to avoid all other NSAIDs when using the Toradol . She is in agreement. Orders placed.   Alise Appl, CNM   Alise Appl, CNM

## 2023-07-26 ENCOUNTER — Telehealth: Payer: Self-pay

## 2023-07-26 DIAGNOSIS — G43709 Chronic migraine without aura, not intractable, without status migrainosus: Secondary | ICD-10-CM

## 2023-07-26 MED ORDER — SUMATRIPTAN SUCCINATE 50 MG PO TABS: 50.0000 mg | ORAL_TABLET | ORAL | 2 refills | Status: DC | PRN

## 2023-07-27 NOTE — Telephone Encounter (Signed)
 Hello Patricia Leon, Patient called today and wants to if it will be alright if she wait to do the ultrasound until we get a tech here.   It would be cheaper to get it done here verses at the [facility].

## 2023-07-28 ENCOUNTER — Other Ambulatory Visit

## 2023-07-28 ENCOUNTER — Encounter: Payer: Self-pay | Admitting: Certified Nurse Midwife

## 2023-07-28 ENCOUNTER — Other Ambulatory Visit: Payer: Self-pay

## 2023-07-28 ENCOUNTER — Ambulatory Visit

## 2023-07-28 DIAGNOSIS — G43709 Chronic migraine without aura, not intractable, without status migrainosus: Secondary | ICD-10-CM

## 2023-07-30 MED ORDER — SUMATRIPTAN SUCCINATE 50 MG PO TABS: 50.0000 mg | ORAL_TABLET | ORAL | 2 refills | Status: AC | PRN

## 2023-07-30 NOTE — Addendum Note (Signed)
 Addended by: Arcelia Bean on: 07/30/2023 08:18 AM   Modules accepted: Orders

## 2023-08-03 ENCOUNTER — Ambulatory Visit: Admitting: Certified Nurse Midwife

## 2023-08-11 ENCOUNTER — Ambulatory Visit (INDEPENDENT_AMBULATORY_CARE_PROVIDER_SITE_OTHER): Admitting: Certified Nurse Midwife

## 2023-08-11 ENCOUNTER — Encounter: Payer: Self-pay | Admitting: Certified Nurse Midwife

## 2023-08-11 VITALS — BP 134/83 | HR 71 | Ht 64.0 in | Wt 207.2 lb

## 2023-08-11 DIAGNOSIS — Z3202 Encounter for pregnancy test, result negative: Secondary | ICD-10-CM | POA: Diagnosis not present

## 2023-08-11 DIAGNOSIS — N920 Excessive and frequent menstruation with regular cycle: Secondary | ICD-10-CM

## 2023-08-11 DIAGNOSIS — Z1211 Encounter for screening for malignant neoplasm of colon: Secondary | ICD-10-CM

## 2023-08-11 DIAGNOSIS — Z01419 Encounter for gynecological examination (general) (routine) without abnormal findings: Secondary | ICD-10-CM | POA: Diagnosis not present

## 2023-08-11 DIAGNOSIS — Z1231 Encounter for screening mammogram for malignant neoplasm of breast: Secondary | ICD-10-CM

## 2023-08-11 LAB — POCT URINE PREGNANCY: Preg Test, Ur: NEGATIVE

## 2023-08-11 MED ORDER — NORETHINDRONE 0.35 MG PO TABS: 1.0000 | ORAL_TABLET | Freq: Every day | ORAL | 3 refills | Status: AC

## 2023-08-11 NOTE — Patient Instructions (Signed)
 Preventive Care 16-48 Years Old, Female  Preventive care refers to lifestyle choices and visits with your health care provider that can promote health and wellness. Preventive care visits are also called wellness exams.  What can I expect for my preventive care visit?  Counseling  Your health care provider may ask you questions about your:  Medical history, including:  Past medical problems.  Family medical history.  Pregnancy history.  Current health, including:  Menstrual cycle.  Method of birth control.  Emotional well-being.  Home life and relationship well-being.  Sexual activity and sexual health.  Lifestyle, including:  Alcohol, nicotine or tobacco, and drug use.  Access to firearms.  Diet, exercise, and sleep habits.  Work and work Astronomer.  Sunscreen use.  Safety issues such as seatbelt and bike helmet use.  Physical exam  Your health care provider will check your:  Height and weight. These may be used to calculate your BMI (body mass index). BMI is a measurement that tells if you are at a healthy weight.  Waist circumference. This measures the distance around your waistline. This measurement also tells if you are at a healthy weight and may help predict your risk of certain diseases, such as type 2 diabetes and high blood pressure.  Heart rate and blood pressure.  Body temperature.  Skin for abnormal spots.  What immunizations do I need?    Vaccines are usually given at various ages, according to a schedule. Your health care provider will recommend vaccines for you based on your age, medical history, and lifestyle or other factors, such as travel or where you work.  What tests do I need?  Screening  Your health care provider may recommend screening tests for certain conditions. This may include:  Lipid and cholesterol levels.  Diabetes screening. This is done by checking your blood sugar (glucose) after you have not eaten for a while (fasting).  Pelvic exam and Pap test.  Hepatitis B test.  Hepatitis C  test.  HIV (human immunodeficiency virus) test.  STI (sexually transmitted infection) testing, if you are at risk.  Lung cancer screening.  Colorectal cancer screening.  Mammogram. Talk with your health care provider about when you should start having regular mammograms. This may depend on whether you have a family history of breast cancer.  BRCA-related cancer screening. This may be done if you have a family history of breast, ovarian, tubal, or peritoneal cancers.  Bone density scan. This is done to screen for osteoporosis.  Talk with your health care provider about your test results, treatment options, and if necessary, the need for more tests.  Follow these instructions at home:  Eating and drinking    Eat a diet that includes fresh fruits and vegetables, whole grains, lean protein, and low-fat dairy products.  Take vitamin and mineral supplements as recommended by your health care provider.  Do not drink alcohol if:  Your health care provider tells you not to drink.  You are pregnant, may be pregnant, or are planning to become pregnant.  If you drink alcohol:  Limit how much you have to 0-1 drink a day.  Know how much alcohol is in your drink. In the [place] one drink equals one 12 oz bottle of beer (355 mL), one 5 oz glass of wine (148 mL), or one 1 oz glass of hard liquor (44 mL).  Lifestyle  Brush your teeth every morning and night with fluoride toothpaste. Floss one time each day.  Exercise for at least  30 minutes 5 or more days each week.  Do not use any products that contain nicotine or tobacco. These products include cigarettes, chewing tobacco, and vaping devices, such as e-cigarettes. If you need help quitting, ask your health care provider.  Do not use drugs.  If you are sexually active, practice safe sex. Use a condom or other form of protection to prevent STIs.  If you do not wish to become pregnant, use a form of birth control. If you plan to become pregnant, see your health care provider for a  prepregnancy visit.  Take aspirin only as told by your health care provider. Make sure that you understand how much to take and what form to take. Work with your health care provider to find out whether it is safe and beneficial for you to take aspirin daily.  Find healthy ways to manage stress, such as:  Meditation, yoga, or listening to music.  Journaling.  Talking to a trusted person.  Spending time with friends and family.  Minimize exposure to UV radiation to reduce your risk of skin cancer.  Safety  Always wear your seat belt while driving or riding in a vehicle.  Do not drive:  If you have been drinking alcohol. Do not ride with someone who has been drinking.  When you are tired or distracted.  While texting.  If you have been using any mind-altering substances or drugs.  Wear a helmet and other protective equipment during sports activities.  If you have firearms in your house, make sure you follow all gun safety procedures.  Seek help if you have been physically or sexually abused.  What's next?  Visit your health care provider once a year for an annual wellness visit.  Ask your health care provider how often you should have your eyes and teeth checked.  Stay up to date on all vaccines.  This information is not intended to replace advice given to you by your health care provider. Make sure you discuss any questions you have with your health care provider.  Document Revised: 08/21/2020 Document Reviewed: 08/21/2020  Elsevier Patient Education  2024 [employer].

## 2023-08-11 NOTE — Progress Notes (Signed)
 [facility]        GYNECOLOGY ANNUAL PREVENTATIVE CARE ENCOUNTER NOTE  History:      Patricia Leon is a 48 y.o. G0P0000 female here for a routine annual gynecologic exam.  Current complaints: pt states she and her husband have discussed and she would like to take medication to help control her cycle .   Denies abnormal vaginal bleeding, discharge, pelvic pain, problems with intercourse or other gynecologic concerns.     Social Relationship:married Living: husband and dog Work:Full time- Lobbyist Exercise:no Smoke/Alcohol/drug use:No/ occasional wine/ no   Gynecologic History Patient's last menstrual period was 07/30/2023 (exact date). Contraception: none Last Pap: 07/23/2022. Results were: normal with negative HPV Last Mammo: 04/11/2021 Bi Rads: 1 Negative Colonoscopy: pt state she had received kit for cologaurd but due to heavy menstural bleeding never did it.Aaron Aas She would like me to reorder.  Obstetric History OB History  Gravida Para Term Preterm AB Living  0 0 0 0 0 0  SAB IAB Ectopic Multiple Live Births  0 0 0 0 0    No past medical history on file.  No past surgical history on file.  Current Outpatient Medications on File Prior to Visit  Medication Sig Dispense Refill   aspirin-acetaminophen-caffeine (EXCEDRIN MIGRAINE) 250-250-65 MG tablet 2 tablets Orally Once a day     meloxicam (MOBIC) 15 MG tablet Take 15 mg by mouth daily.     SUMAtriptan  (IMITREX ) 50 MG tablet Take 1 tablet (50 mg total) by mouth every 2 (two) hours as needed for migraine. May repeat in 2 hours if headache persists or recurs. Max dose 200 mg in 24 hours 10 tablet 2   ketorolac  (TORADOL ) 10 MG tablet Take 1 tablet (10 mg total) by mouth every 6 (six) hours as needed. (Patient not taking: Reported on 08/11/2023) 20 tablet 0   No current facility-administered medications on file prior to visit.    Allergies  Allergen Reactions   Sulfa Antibiotics Rash    Social History:  reports that  she has never smoked. She has never used smokeless tobacco. She reports current alcohol use. She reports that she does not use drugs.  Family History  Problem Relation Age of Onset   Breast cancer Maternal Aunt 85       1/2 sister by same father   Varicose Veins Maternal Grandmother    Heart disease Maternal Grandfather     The following portions of the patient's history were reviewed and updated as appropriate: allergies, current medications, past family history, past medical history, past social history, past surgical history and problem list.  Review of Systems Pertinent items noted in HPI and remainder of comprehensive ROS otherwise negative.  Physical Exam:  BP 134/83   Pulse 71   Ht 5\' 4"  (1.626 m)   Wt 207 lb 3.2 oz (94 kg)   LMP 07/30/2023 (Exact Date)   BMI 35.57 kg/m  CONSTITUTIONAL: Well-developed, well-nourished female in no acute distress.  HENT:  Normocephalic, atraumatic, External right and left ear normal. Oropharynx is clear and moist EYES: Conjunctivae and EOM are normal. Pupils are equal, round, and reactive to light. No scleral icterus.  NECK: Normal range of motion, supple, no masses.  Normal thyroid.  SKIN: Skin is warm and dry. No rash noted. Not diaphoretic. No erythema. No pallor. MUSCULOSKELETAL: Normal range of motion. No tenderness.  No cyanosis, clubbing, or edema.  2+ distal pulses. NEUROLOGIC: Alert and oriented to person, place, and time. Normal reflexes, muscle tone  coordination.  PSYCHIATRIC: Normal mood and affect. Normal behavior. Normal judgment and thought content. CARDIOVASCULAR: Normal heart rate noted, regular rhythm RESPIRATORY: Clear to auscultation bilaterally. Effort and breath sounds normal, no problems with respiration noted. BREASTS: Symmetric in size. No masses, tenderness, skin changes, nipple drainage, or lymphadenopathy bilaterally.  ABDOMEN: Soft, no distention noted.  No tenderness, rebound or guarding.  PELVIC: Normal appearing  external genitalia and urethral meatus; normal appearing vaginal mucosa and cervix.  No abnormal discharge noted.  Pap smear not due.  Normal uterine size, no other palpable masses, no uterine or adnexal tenderness.  .   Assessment and Plan:   Annual Well Women GYN Exam  Pap not due  Mammogram : ordered Labs: declines  Refills: POP, reviewed progestin only options for cycle control. She request to try pill  Referral: none Routine preventative health maintenance measures emphasized. Please refer to After Visit Summary for other counseling recommendations.      Alise Appl, CNM [facility]  [facility],  [facility]

## 2023-08-12 NOTE — Addendum Note (Signed)
 Addended by: Martene Skye on: 08/12/2023 07:44 AM   Modules accepted: Orders

## 2023-09-06 ENCOUNTER — Other Ambulatory Visit

## 2023-09-20 ENCOUNTER — Telehealth: Payer: Self-pay | Admitting: Certified Nurse Midwife

## 2023-09-20 ENCOUNTER — Other Ambulatory Visit

## 2023-09-20 NOTE — Telephone Encounter (Signed)
 Reached out to pt to reschedule gyn US  that was scheduled on 09/20/2023 at 11:15 (Patricia Leon).  Left message for pt to call back to reschedule.

## 2023-09-21 ENCOUNTER — Encounter: Payer: Self-pay | Admitting: Certified Nurse Midwife

## 2023-09-21 NOTE — Telephone Encounter (Signed)
 Reached out to pt (2x) to reschedule gyn US  that was scheduled on 09/20/2023 at 11:15 (A. Thompson)  Left message for pt to call back to reschedule.  Will send a MyChart letter to pt.

## 2023-09-24 ENCOUNTER — Other Ambulatory Visit

## 2023-09-24 DIAGNOSIS — N83291 Other ovarian cyst, right side: Secondary | ICD-10-CM | POA: Diagnosis not present

## 2023-10-04 ENCOUNTER — Encounter: Payer: Self-pay | Admitting: Certified Nurse Midwife
